# Patient Record
Sex: Female | Born: 1940 | Race: White | Hispanic: No | Smoking: Former smoker
Health system: Southern US, Community
[De-identification: ages and names within clinical notes are randomized; demographics above are authoritative.]

## PROBLEM LIST (undated history)

## (undated) DIAGNOSIS — E7800 Pure hypercholesterolemia, unspecified: Secondary | ICD-10-CM

## (undated) DIAGNOSIS — Z9981 Dependence on supplemental oxygen: Secondary | ICD-10-CM

## (undated) DIAGNOSIS — J449 Chronic obstructive pulmonary disease, unspecified: Secondary | ICD-10-CM

## (undated) DIAGNOSIS — I509 Heart failure, unspecified: Secondary | ICD-10-CM

## (undated) DIAGNOSIS — C801 Malignant (primary) neoplasm, unspecified: Secondary | ICD-10-CM

## (undated) DIAGNOSIS — I739 Peripheral vascular disease, unspecified: Secondary | ICD-10-CM

## (undated) DIAGNOSIS — I2510 Atherosclerotic heart disease of native coronary artery without angina pectoris: Secondary | ICD-10-CM

## (undated) DIAGNOSIS — F329 Major depressive disorder, single episode, unspecified: Secondary | ICD-10-CM

## (undated) DIAGNOSIS — G8929 Other chronic pain: Secondary | ICD-10-CM

## (undated) DIAGNOSIS — I779 Disorder of arteries and arterioles, unspecified: Secondary | ICD-10-CM

## (undated) DIAGNOSIS — Z8673 Personal history of transient ischemic attack (TIA), and cerebral infarction without residual deficits: Secondary | ICD-10-CM

## (undated) DIAGNOSIS — G4730 Sleep apnea, unspecified: Secondary | ICD-10-CM

## (undated) DIAGNOSIS — N19 Unspecified kidney failure: Secondary | ICD-10-CM

## (undated) DIAGNOSIS — K219 Gastro-esophageal reflux disease without esophagitis: Secondary | ICD-10-CM

## (undated) DIAGNOSIS — M1990 Unspecified osteoarthritis, unspecified site: Secondary | ICD-10-CM

## (undated) DIAGNOSIS — F32A Depression, unspecified: Secondary | ICD-10-CM

## (undated) HISTORY — DX: Personal history of transient ischemic attack (TIA), and cerebral infarction without residual deficits: Z86.73

## (undated) HISTORY — DX: Malignant (primary) neoplasm, unspecified: C80.1

## (undated) HISTORY — DX: Disorder of arteries and arterioles, unspecified: I77.9

## (undated) HISTORY — PX: BLADDER SUSPENSION: SHX72

## (undated) HISTORY — DX: Atherosclerotic heart disease of native coronary artery without angina pectoris: I25.10

## (undated) HISTORY — PX: ABDOMINAL HYSTERECTOMY: SHX81

## (undated) HISTORY — DX: Chronic obstructive pulmonary disease, unspecified: J44.9

## (undated) HISTORY — PX: COLONOSCOPY: SHX174

## (undated) HISTORY — DX: Unspecified osteoarthritis, unspecified site: M19.90

## (undated) HISTORY — DX: Peripheral vascular disease, unspecified: I73.9

---

## 2014-02-20 ENCOUNTER — Encounter: Payer: Self-pay | Admitting: Cardiology

## 2014-02-20 ENCOUNTER — Ambulatory Visit (INDEPENDENT_AMBULATORY_CARE_PROVIDER_SITE_OTHER): Payer: Medicare (Managed Care) | Admitting: Cardiology

## 2014-02-20 VITALS — BP 96/60 | HR 74 | Ht 66.0 in | Wt 203.0 lb

## 2014-02-20 DIAGNOSIS — E785 Hyperlipidemia, unspecified: Secondary | ICD-10-CM | POA: Insufficient documentation

## 2014-02-20 DIAGNOSIS — I6529 Occlusion and stenosis of unspecified carotid artery: Secondary | ICD-10-CM | POA: Insufficient documentation

## 2014-02-20 DIAGNOSIS — I25119 Atherosclerotic heart disease of native coronary artery with unspecified angina pectoris: Secondary | ICD-10-CM

## 2014-02-20 DIAGNOSIS — I779 Disorder of arteries and arterioles, unspecified: Secondary | ICD-10-CM

## 2014-02-20 DIAGNOSIS — I2510 Atherosclerotic heart disease of native coronary artery without angina pectoris: Secondary | ICD-10-CM | POA: Insufficient documentation

## 2014-02-20 DIAGNOSIS — I6523 Occlusion and stenosis of bilateral carotid arteries: Secondary | ICD-10-CM | POA: Diagnosis not present

## 2014-02-20 DIAGNOSIS — I739 Peripheral vascular disease, unspecified: Principal | ICD-10-CM

## 2014-02-20 DIAGNOSIS — Z0000 Encounter for general adult medical examination without abnormal findings: Secondary | ICD-10-CM

## 2014-02-20 NOTE — Progress Notes (Signed)
Reason for visit: CAD, carotid artery disease, hyperlipidemia  Clinical Summary [place] is a 73 y.o.female presenting to establish cardiology follow-up. She is here with her husband today. They moved here from [place] approximately 2 months ago, she has several family members here in [place]. At this point she has not established with a primary care provider, husband states that they had made several phone calls with no LOC as yet.  I reviewed available records and her cardiac history is detailed below. She reports occasional angina symptoms and nitroglycerin use, nothing progressive over the last 6 months however. Cardiac regimen looks to be fairly well-rounded, as outlined below.  She was previously followed by Dr. Randa [place] with [facility] in [place]. Following an abnormal stress test demonstrating anterior ischemia, she underwent cardiac catheterization at [place] in March 2013 which demonstrated an occluded mid LAD with left to left collaterals from the diagonal system, 75% first diagonal stenosis. She was managed medically with otherwise minor residual atherosclerosis in the circumflex and RCA distribution and preserved LVEF.  Carotid Dopplers from 2009 reported tortuous bilateral ICAs with 40-60% stenosis on the right. She may have had follow-up studies since that time based on her discussion, although I have no reports.  Lab work from May of this year showed potassium 4.6, BUN 31, creatinine 2.2, hemoglobin 11.1, platelets 269, cholesterol 194, LDL 73, triglycerides 70, AST 28, ALT 27. She states that she has tolerated Lipitor.   Allergies  Allergen Reactions  . Ace Inhibitors Swelling    Current Outpatient Prescriptions  Medication Sig Dispense Refill  . albuterol (PROVENTIL) (2.5 MG/3ML) 0.083% nebulizer solution Take 2.5 mg by nebulization every 6 (six) hours as needed for wheezing or shortness of breath.    .  ARIPiprazole (ABILIFY) 30 MG tablet Take 30 mg by mouth daily.    Marland Kitchen aspirin 81 MG tablet Take 81 mg by mouth daily.    Marland Kitchen atorvastatin (LIPITOR) 80 MG tablet Take 80 mg by mouth daily.    . budesonide-formoterol (SYMBICORT) 160-4.5 MCG/ACT inhaler Inhale 2 puffs into the lungs 2 (two) times daily.    . clopidogrel (PLAVIX) 75 MG tablet Take 75 mg by mouth daily.    . ferrous sulfate 325 (65 FE) MG tablet Take 325 mg by mouth 2 (two) times daily with a meal.    . folic acid (FOLVITE) 248 MCG tablet Take 400 mcg by mouth daily.    . furosemide (LASIX) 40 MG tablet Take 40 mg by mouth as needed.    . hydrochlorothiazide (HYDRODIURIL) 25 MG tablet Take 25 mg by mouth daily.    . isosorbide mononitrate (IMDUR) 30 MG 24 hr tablet Take 30 mg by mouth daily.    . lansoprazole (PREVACID) 30 MG capsule Take 30 mg by mouth daily at 12 noon.    Marland Kitchen LORazepam (ATIVAN) 1 MG tablet Take 1 mg by mouth 2 (two) times daily as needed for anxiety.    . metoprolol (LOPRESSOR) 50 MG tablet Take 50 mg by mouth daily.     . niacin (NIASPAN) 500 MG CR tablet Take 500 mg by mouth at bedtime.    . nitroGLYCERIN (NITROSTAT) 0.4 MG SL tablet Place 0.4 mg under the tongue every 5 (five) minutes as needed for chest pain.    . ranolazine (RANEXA) 500 MG 12 hr tablet Take 500 mg by mouth 2 (two) times daily.    Marland Kitchen venlafaxine XR (EFFEXOR-XR) 150 MG 24 hr capsule Take  150 mg by mouth daily with breakfast.     No current facility-administered medications for this visit.    Past Medical History  Diagnosis Date  . Osteoarthritis   . History of stroke   . COPD (chronic obstructive pulmonary disease)   . Carotid artery disease   . CAD (coronary artery disease), native coronary artery     Known occluded mid LAD with left to left collaterals, 75% first diagonal - cardiac catheterization in March 2013, [place]  . Cancer     Past Surgical History  Procedure Laterality Date  . Abdominal hysterectomy    . Bladder suspension     . Colonoscopy      Family History  Problem Relation Age of Onset  . CAD Mother   . CAD Father   . Diabetes Mellitus II Sister   . Hypertension Brother   . CAD Brother     Social History [name] reports that she has quit smoking. Her smoking use included Cigarettes. She smoked 0.00 packs per day. She does not have any smokeless tobacco history on file. [name] reports that she does not drink alcohol.  Review of Systems Complete review of systems negative except as otherwise outlined in the clinical summary and also the following.  Physical Examination Filed Vitals:   02/20/14 1114  BP: 96/60  Pulse: 74   Filed Weights   02/20/14 1114  Weight: 203 lb (92.08 kg)   Chronically ill-appearing woman, no distress. HEENT: Conjunctiva and lids normal, oropharynx clear. Neck: Supple, no elevated JVP, soft right carotid bruit, no thyromegaly. Lungs: Clear to auscultation with diminished breath sounds throughout, nonlabored breathing at rest. Cardiac: Regular rate and rhythm, no S3 or significant systolic murmur, no pericardial rub. Abdomen: Soft, nontender, bowel sounds present, no guarding or rebound. Extremities: Trace ankle edema, distal pulses 2+. Skin: Warm and dry. Musculoskeletal: Mild kyphosis. Neuropsychiatric: Alert and oriented x3, affect grossly appropriate.   Problem List and Plan   CAD (coronary artery disease), native coronary artery Cardiac history outlined above. Patient establishing for regular cardiac follow-up. I reviewed her cardiac medications. No additional schema testing being arranged at this time, most recent heart catheterization in 2013 reviewed. 6 month follow-up arranged, sooner if needed.  Carotid artery occlusion Not entirely clear when this was last objectively evaluated. Follow-up carotid Dopplers will be obtained. She continues on antiplatelet regimen and statin.  Hyperlipidemia Continue high-dose Lipitor. Follow-up FLP and LFTs for  new baseline.  Healthcare maintenance Information about local primary care providers given to the patient and her husband. Office also assisted in arranging a visit with Dr. Legrand Sims. I made it clear that she needs to establish with a primary care provider soon to address other health concerns.    Satira Sark, M.D., F.A.C.C.

## 2014-02-20 NOTE — Patient Instructions (Addendum)
Your physician wants you to follow-up in: 6 months You will receive a reminder letter in the mail two months in advance. If you don't receive a letter, please call our office to schedule the follow-up appointment.    Please get FASTING lab work (LFT'S,LIPID)    Your physician recommends that you continue on your current medications as directed. Please refer to the Current Medication list given to you today.     Your physician has requested that you have a carotid duplex. This test is an ultrasound of the carotid arteries in your neck. It looks at blood flow through these arteries that supply the brain with blood. Allow one hour for this exam. There are no restrictions or special instructions.          Thank you for choosing [facility] !       Dr Legrand Rams [phone] is taking new patients

## 2014-02-20 NOTE — Assessment & Plan Note (Signed)
Information about local primary care providers given to the patient and her husband. Office also assisted in arranging a visit with Dr. Legrand Rams. I made it clear that she needs to establish with a primary care provider soon to address other health concerns.

## 2014-02-20 NOTE — Assessment & Plan Note (Signed)
Continue high-dose Lipitor. Follow-up FLP and LFTs for new baseline.

## 2014-02-20 NOTE — Assessment & Plan Note (Signed)
Not entirely clear when this was last objectively evaluated. Follow-up carotid Dopplers will be obtained. She continues on antiplatelet regimen and statin.

## 2014-02-20 NOTE — Assessment & Plan Note (Signed)
Cardiac history outlined above. Patient establishing for regular cardiac follow-up. I reviewed her cardiac medications. No additional schema testing being arranged at this time, most recent heart catheterization in 2013 reviewed. 6 month follow-up arranged, sooner if needed.

## 2014-03-01 ENCOUNTER — Encounter (HOSPITAL_COMMUNITY): Payer: Self-pay | Admitting: Emergency Medicine

## 2014-03-01 ENCOUNTER — Inpatient Hospital Stay (HOSPITAL_COMMUNITY)
Admission: EM | Admit: 2014-03-01 | Discharge: 2014-03-06 | DRG: 291 | Disposition: A | Discharge status: Home Health | Payer: Medicare (Managed Care) | Attending: Internal Medicine | Admitting: Internal Medicine

## 2014-03-01 ENCOUNTER — Emergency Department (HOSPITAL_COMMUNITY): Payer: Medicare (Managed Care)

## 2014-03-01 DIAGNOSIS — K219 Gastro-esophageal reflux disease without esophagitis: Secondary | ICD-10-CM | POA: Diagnosis present

## 2014-03-01 DIAGNOSIS — R0602 Shortness of breath: Secondary | ICD-10-CM | POA: Diagnosis present

## 2014-03-01 DIAGNOSIS — Z7902 Long term (current) use of antithrombotics/antiplatelets: Secondary | ICD-10-CM | POA: Diagnosis not present

## 2014-03-01 DIAGNOSIS — Z8249 Family history of ischemic heart disease and other diseases of the circulatory system: Secondary | ICD-10-CM

## 2014-03-01 DIAGNOSIS — I5033 Acute on chronic diastolic (congestive) heart failure: Secondary | ICD-10-CM | POA: Diagnosis present

## 2014-03-01 DIAGNOSIS — M1990 Unspecified osteoarthritis, unspecified site: Secondary | ICD-10-CM | POA: Diagnosis present

## 2014-03-01 DIAGNOSIS — E780 Pure hypercholesterolemia: Secondary | ICD-10-CM | POA: Diagnosis present

## 2014-03-01 DIAGNOSIS — E785 Hyperlipidemia, unspecified: Secondary | ICD-10-CM | POA: Diagnosis present

## 2014-03-01 DIAGNOSIS — J441 Chronic obstructive pulmonary disease with (acute) exacerbation: Secondary | ICD-10-CM | POA: Diagnosis present

## 2014-03-01 DIAGNOSIS — Z9981 Dependence on supplemental oxygen: Secondary | ICD-10-CM | POA: Diagnosis not present

## 2014-03-01 DIAGNOSIS — Z87891 Personal history of nicotine dependence: Secondary | ICD-10-CM

## 2014-03-01 DIAGNOSIS — I2510 Atherosclerotic heart disease of native coronary artery without angina pectoris: Secondary | ICD-10-CM | POA: Diagnosis present

## 2014-03-01 DIAGNOSIS — F419 Anxiety disorder, unspecified: Secondary | ICD-10-CM | POA: Diagnosis present

## 2014-03-01 DIAGNOSIS — R42 Dizziness and giddiness: Secondary | ICD-10-CM

## 2014-03-01 DIAGNOSIS — I509 Heart failure, unspecified: Secondary | ICD-10-CM

## 2014-03-01 DIAGNOSIS — D649 Anemia, unspecified: Secondary | ICD-10-CM | POA: Diagnosis present

## 2014-03-01 DIAGNOSIS — J189 Pneumonia, unspecified organism: Secondary | ICD-10-CM

## 2014-03-01 DIAGNOSIS — Z8673 Personal history of transient ischemic attack (TIA), and cerebral infarction without residual deficits: Secondary | ICD-10-CM

## 2014-03-01 DIAGNOSIS — N183 Chronic kidney disease, stage 3 unspecified: Secondary | ICD-10-CM

## 2014-03-01 DIAGNOSIS — Z79899 Other long term (current) drug therapy: Secondary | ICD-10-CM | POA: Diagnosis not present

## 2014-03-01 DIAGNOSIS — Z7982 Long term (current) use of aspirin: Secondary | ICD-10-CM | POA: Diagnosis not present

## 2014-03-01 DIAGNOSIS — Z833 Family history of diabetes mellitus: Secondary | ICD-10-CM | POA: Diagnosis not present

## 2014-03-01 HISTORY — DX: Other chronic pain: G89.29

## 2014-03-01 HISTORY — DX: Major depressive disorder, single episode, unspecified: F32.9

## 2014-03-01 HISTORY — DX: Gastro-esophageal reflux disease without esophagitis: K21.9

## 2014-03-01 HISTORY — DX: Pure hypercholesterolemia, unspecified: E78.00

## 2014-03-01 HISTORY — DX: Depression, unspecified: F32.A

## 2014-03-01 HISTORY — DX: Dependence on supplemental oxygen: Z99.81

## 2014-03-01 LAB — CBC WITH DIFFERENTIAL/PLATELET
BASOS ABS: 0 10*3/uL (ref 0.0–0.1)
BASOS PCT: 0 % (ref 0–1)
EOS ABS: 0.4 10*3/uL (ref 0.0–0.7)
Eosinophils Relative: 5 % (ref 0–5)
HCT: 31.3 % — ABNORMAL LOW (ref 36.0–46.0)
Hemoglobin: 10.1 g/dL — ABNORMAL LOW (ref 12.0–15.0)
Lymphocytes Relative: 29 % (ref 12–46)
Lymphs Abs: 2.6 10*3/uL (ref 0.7–4.0)
MCH: 31.2 pg (ref 26.0–34.0)
MCHC: 32.3 g/dL (ref 30.0–36.0)
MCV: 96.6 fL (ref 78.0–100.0)
MONOS PCT: 8 % (ref 3–12)
Monocytes Absolute: 0.7 10*3/uL (ref 0.1–1.0)
NEUTROS ABS: 5.3 10*3/uL (ref 1.7–7.7)
NEUTROS PCT: 58 % (ref 43–77)
PLATELETS: 237 10*3/uL (ref 150–400)
RBC: 3.24 MIL/uL — ABNORMAL LOW (ref 3.87–5.11)
RDW: 13.5 % (ref 11.5–15.5)
WBC: 9 10*3/uL (ref 4.0–10.5)

## 2014-03-01 LAB — CBC
HCT: 31.5 % — ABNORMAL LOW (ref 36.0–46.0)
Hemoglobin: 10.6 g/dL — ABNORMAL LOW (ref 12.0–15.0)
MCH: 31.9 pg (ref 26.0–34.0)
MCHC: 33.7 g/dL (ref 30.0–36.0)
MCV: 94.9 fL (ref 78.0–100.0)
PLATELETS: 223 10*3/uL (ref 150–400)
RBC: 3.32 MIL/uL — ABNORMAL LOW (ref 3.87–5.11)
RDW: 13.9 % (ref 11.5–15.5)
WBC: 9.9 10*3/uL (ref 4.0–10.5)

## 2014-03-01 LAB — BASIC METABOLIC PANEL
ANION GAP: 12 (ref 5–15)
BUN: 20 mg/dL (ref 6–23)
CALCIUM: 9.4 mg/dL (ref 8.4–10.5)
CO2: 30 mEq/L (ref 19–32)
Chloride: 97 mEq/L (ref 96–112)
Creatinine, Ser: 1.41 mg/dL — ABNORMAL HIGH (ref 0.50–1.10)
GFR, EST AFRICAN AMERICAN: 41 mL/min — AB (ref >90)
GFR, EST NON AFRICAN AMERICAN: 36 mL/min — AB (ref >90)
GLUCOSE: 114 mg/dL — AB (ref 70–99)
Potassium: 4.1 mEq/L (ref 3.7–5.3)
Sodium: 139 mEq/L (ref 137–147)

## 2014-03-01 LAB — PRO B NATRIURETIC PEPTIDE: Pro B Natriuretic peptide (BNP): 834.7 pg/mL — ABNORMAL HIGH (ref 0–125)

## 2014-03-01 LAB — TROPONIN I

## 2014-03-01 MED ORDER — ALBUTEROL (5 MG/ML) CONTINUOUS INHALATION SOLN
10.0000 mg/h | INHALATION_SOLUTION | Freq: Once | RESPIRATORY_TRACT | Status: AC
  Administered 2014-03-01: 10 mg/h via RESPIRATORY_TRACT

## 2014-03-01 MED ORDER — METOPROLOL TARTRATE 50 MG PO TABS
75.0000 mg | ORAL_TABLET | Freq: Every morning | ORAL | Status: DC
  Administered 2014-03-02 – 2014-03-05 (×4): 75 mg via ORAL
  Filled 2014-03-01 (×10): qty 1

## 2014-03-01 MED ORDER — BUDESONIDE 0.25 MG/2ML IN SUSP
0.2500 mg | Freq: Two times a day (BID) | RESPIRATORY_TRACT | Status: DC
  Administered 2014-03-02 – 2014-03-06 (×9): 0.25 mg via RESPIRATORY_TRACT
  Filled 2014-03-01 (×11): qty 2

## 2014-03-01 MED ORDER — ASPIRIN EC 325 MG PO TBEC
325.0000 mg | DELAYED_RELEASE_TABLET | Freq: Every day | ORAL | Status: DC
  Filled 2014-03-01: qty 1

## 2014-03-01 MED ORDER — IPRATROPIUM BROMIDE 0.02 % IN SOLN
0.5000 mg | Freq: Four times a day (QID) | RESPIRATORY_TRACT | Status: DC
  Administered 2014-03-02 (×4): 0.5 mg via RESPIRATORY_TRACT
  Filled 2014-03-01 (×4): qty 2.5

## 2014-03-01 MED ORDER — FUROSEMIDE 10 MG/ML IJ SOLN
40.0000 mg | Freq: Two times a day (BID) | INTRAMUSCULAR | Status: DC
  Administered 2014-03-02 – 2014-03-03 (×3): 40 mg via INTRAVENOUS
  Filled 2014-03-01 (×3): qty 4

## 2014-03-01 MED ORDER — ONDANSETRON HCL 4 MG/2ML IJ SOLN: 4.0000 mg | Freq: Four times a day (QID) | INTRAMUSCULAR | Status: DC | PRN

## 2014-03-01 MED ORDER — IPRATROPIUM BROMIDE 0.02 % IN SOLN
RESPIRATORY_TRACT | Status: AC
  Administered 2014-03-01: 18:00:00
  Filled 2014-03-01: qty 2.5

## 2014-03-01 MED ORDER — FOLIC ACID 1 MG PO TABS
1000.0000 ug | ORAL_TABLET | Freq: Every morning | ORAL | Status: DC
  Administered 2014-03-02 – 2014-03-05 (×4): 1 mg via ORAL
  Filled 2014-03-01 (×5): qty 1

## 2014-03-01 MED ORDER — NITROGLYCERIN 0.4 MG SL SUBL: 0.4000 mg | SUBLINGUAL_TABLET | SUBLINGUAL | Status: DC | PRN

## 2014-03-01 MED ORDER — ONDANSETRON HCL 4 MG PO TABS
4.0000 mg | ORAL_TABLET | Freq: Four times a day (QID) | ORAL | Status: DC | PRN
  Administered 2014-03-03: 4 mg via ORAL
  Filled 2014-03-01: qty 1

## 2014-03-01 MED ORDER — NIACIN ER (ANTIHYPERLIPIDEMIC) 500 MG PO TBCR
500.0000 mg | EXTENDED_RELEASE_TABLET | Freq: Every day | ORAL | Status: DC
Start: 2014-03-01 — End: 2014-03-06
  Administered 2014-03-02 – 2014-03-05 (×5): 500 mg via ORAL
  Filled 2014-03-01 (×5): qty 1

## 2014-03-01 MED ORDER — RANOLAZINE ER 500 MG PO TB12
500.0000 mg | ORAL_TABLET | Freq: Two times a day (BID) | ORAL | Status: DC
Start: 2014-03-01 — End: 2014-03-06
  Administered 2014-03-02 – 2014-03-05 (×9): 500 mg via ORAL
  Filled 2014-03-01 (×10): qty 1

## 2014-03-01 MED ORDER — VENLAFAXINE HCL ER 75 MG PO CP24
150.0000 mg | ORAL_CAPSULE | Freq: Every day | ORAL | Status: DC
Start: 2014-03-02 — End: 2014-03-06
  Administered 2014-03-02 – 2014-03-05 (×4): 150 mg via ORAL
  Filled 2014-03-01 (×5): qty 2

## 2014-03-01 MED ORDER — NIACIN 500 MG PO TABS
ORAL_TABLET | ORAL | Status: AC
  Filled 2014-03-01: qty 1

## 2014-03-01 MED ORDER — MONTELUKAST SODIUM 10 MG PO TABS
10.0000 mg | ORAL_TABLET | Freq: Every morning | ORAL | Status: DC
  Administered 2014-03-02 – 2014-03-05 (×4): 10 mg via ORAL
  Filled 2014-03-01 (×5): qty 1

## 2014-03-01 MED ORDER — CLOPIDOGREL BISULFATE 75 MG PO TABS
75.0000 mg | ORAL_TABLET | Freq: Every day | ORAL | Status: DC
Start: 2014-03-02 — End: 2014-03-06
  Administered 2014-03-02 – 2014-03-05 (×4): 75 mg via ORAL
  Filled 2014-03-01 (×5): qty 1

## 2014-03-01 MED ORDER — IPRATROPIUM BROMIDE 0.02 % IN SOLN
1.0000 mg | Freq: Once | RESPIRATORY_TRACT | Status: AC
  Administered 2014-03-01: 1 mg via RESPIRATORY_TRACT

## 2014-03-01 MED ORDER — ARIPIPRAZOLE 10 MG PO TABS
30.0000 mg | ORAL_TABLET | Freq: Every morning | ORAL | Status: DC
  Administered 2014-03-02 – 2014-03-05 (×4): 30 mg via ORAL
  Filled 2014-03-01 (×5): qty 3

## 2014-03-01 MED ORDER — ISOSORBIDE MONONITRATE ER 60 MG PO TB24
30.0000 mg | ORAL_TABLET | Freq: Every morning | ORAL | Status: DC
Start: 2014-03-02 — End: 2014-03-06
  Administered 2014-03-02 – 2014-03-05 (×4): 30 mg via ORAL
  Filled 2014-03-01 (×5): qty 1

## 2014-03-01 MED ORDER — ATORVASTATIN CALCIUM 40 MG PO TABS
80.0000 mg | ORAL_TABLET | Freq: Every evening | ORAL | Status: DC
  Administered 2014-03-02 – 2014-03-05 (×5): 80 mg via ORAL
  Filled 2014-03-01 (×6): qty 2

## 2014-03-01 MED ORDER — LEVALBUTEROL HCL 0.63 MG/3ML IN NEBU: 0.6300 mg | INHALATION_SOLUTION | Freq: Four times a day (QID) | RESPIRATORY_TRACT | Status: DC | PRN

## 2014-03-01 MED ORDER — FUROSEMIDE 10 MG/ML IJ SOLN
40.0000 mg | Freq: Once | INTRAMUSCULAR | Status: AC
  Administered 2014-03-01: 40 mg via INTRAVENOUS
  Filled 2014-03-01: qty 4

## 2014-03-01 MED ORDER — LEVALBUTEROL HCL 0.63 MG/3ML IN NEBU
0.6300 mg | INHALATION_SOLUTION | Freq: Once | RESPIRATORY_TRACT | Status: AC
  Administered 2014-03-01: 0.63 mg via RESPIRATORY_TRACT
  Filled 2014-03-01: qty 3

## 2014-03-01 MED ORDER — HYDROCODONE-ACETAMINOPHEN 5-325 MG PO TABS
1.0000 | ORAL_TABLET | Freq: Once | ORAL | Status: AC
  Administered 2014-03-01: 1 via ORAL
  Filled 2014-03-01: qty 1

## 2014-03-01 MED ORDER — ALBUTEROL (5 MG/ML) CONTINUOUS INHALATION SOLN
INHALATION_SOLUTION | RESPIRATORY_TRACT | Status: AC
  Administered 2014-03-01: 18:00:00
  Filled 2014-03-01: qty 20

## 2014-03-01 MED ORDER — ACETAMINOPHEN 325 MG PO TABS: 650.0000 mg | ORAL_TABLET | Freq: Four times a day (QID) | ORAL | Status: DC | PRN

## 2014-03-01 MED ORDER — ENOXAPARIN SODIUM 40 MG/0.4ML ~~LOC~~ SOLN
40.0000 mg | SUBCUTANEOUS | Status: DC
  Administered 2014-03-02 – 2014-03-05 (×4): 40 mg via SUBCUTANEOUS
  Filled 2014-03-01 (×4): qty 0.4

## 2014-03-01 MED ORDER — ACETAMINOPHEN 650 MG RE SUPP: 650.0000 mg | Freq: Four times a day (QID) | RECTAL | Status: DC | PRN

## 2014-03-01 MED ORDER — HYDROCHLOROTHIAZIDE 25 MG PO TABS
25.0000 mg | ORAL_TABLET | Freq: Every morning | ORAL | Status: DC
  Filled 2014-03-01: qty 1

## 2014-03-01 MED ORDER — LEVALBUTEROL HCL 0.63 MG/3ML IN NEBU
0.6300 mg | INHALATION_SOLUTION | Freq: Four times a day (QID) | RESPIRATORY_TRACT | Status: DC
Start: 2014-03-02 — End: 2014-03-02
  Administered 2014-03-02 (×4): 0.63 mg via RESPIRATORY_TRACT
  Filled 2014-03-01 (×4): qty 3

## 2014-03-01 MED ORDER — SODIUM CHLORIDE 0.9 % IJ SOLN
3.0000 mL | Freq: Two times a day (BID) | INTRAMUSCULAR | Status: DC
  Administered 2014-03-02 – 2014-03-05 (×2): 3 mL via INTRAVENOUS

## 2014-03-01 MED ORDER — ASPIRIN EC 81 MG PO TBEC
81.0000 mg | DELAYED_RELEASE_TABLET | Freq: Every morning | ORAL | Status: DC
  Administered 2014-03-02 – 2014-03-05 (×4): 81 mg via ORAL
  Filled 2014-03-01 (×5): qty 1

## 2014-03-01 MED ORDER — PANTOPRAZOLE SODIUM 40 MG PO TBEC
40.0000 mg | DELAYED_RELEASE_TABLET | Freq: Every day | ORAL | Status: DC
  Administered 2014-03-02 – 2014-03-05 (×4): 40 mg via ORAL
  Filled 2014-03-01 (×5): qty 1

## 2014-03-01 MED ORDER — ALBUTEROL SULFATE (2.5 MG/3ML) 0.083% IN NEBU
2.5000 mg | INHALATION_SOLUTION | Freq: Once | RESPIRATORY_TRACT | Status: AC
  Administered 2014-03-01: 2.5 mg via RESPIRATORY_TRACT
  Filled 2014-03-01: qty 3

## 2014-03-01 MED ORDER — LORAZEPAM 1 MG PO TABS
1.0000 mg | ORAL_TABLET | Freq: Two times a day (BID) | ORAL | Status: DC | PRN
Start: 2014-03-01 — End: 2014-03-06
  Administered 2014-03-02 – 2014-03-05 (×4): 1 mg via ORAL
  Filled 2014-03-01 (×4): qty 1

## 2014-03-01 MED ORDER — METHYLPREDNISOLONE SODIUM SUCC 125 MG IJ SOLR
125.0000 mg | Freq: Once | INTRAMUSCULAR | Status: AC
  Administered 2014-03-01: 125 mg via INTRAVENOUS
  Filled 2014-03-01: qty 2

## 2014-03-01 MED ORDER — SODIUM CHLORIDE 0.9 % IJ SOLN
3.0000 mL | Freq: Two times a day (BID) | INTRAMUSCULAR | Status: DC
  Administered 2014-03-02 – 2014-03-05 (×8): 3 mL via INTRAVENOUS

## 2014-03-01 MED ORDER — FERROUS SULFATE 325 (65 FE) MG PO TABS
325.0000 mg | ORAL_TABLET | Freq: Two times a day (BID) | ORAL | Status: DC
  Administered 2014-03-02 – 2014-03-05 (×8): 325 mg via ORAL
  Filled 2014-03-01 (×9): qty 1

## 2014-03-01 MED ORDER — IPRATROPIUM-ALBUTEROL 0.5-2.5 (3) MG/3ML IN SOLN
3.0000 mL | Freq: Once | RESPIRATORY_TRACT | Status: AC
  Administered 2014-03-01: 3 mL via RESPIRATORY_TRACT
  Filled 2014-03-01: qty 3

## 2014-03-01 NOTE — H&P (Signed)
[facility] History and Physical  [place] NWG:[id number] DOB: [birth year] DOA: 03/01/2014  Referring physician: ER physician. PCP: Rosita Fire, MD patient just moved from [place] and is here to establish primary care.  Specialists: Dr. Conni Elliot. Cardiologist.  Chief Complaint: Shortness of breath.  HPI: [place] is a 73 y.o. female with history of CAD managed medically, COPD, hyperlipidemia, carotid stenosis presents to the ER because of worsening shortness of breath. Patient states she has been short of breath over the last 6 days with increasing lower extremity edema. Patient shortness of breath increases on exertion and patient also has been having some productive cough with pleuritic type of chest pain. In the ER patient was found to be wheezing and was placed on nebulizer. On exam patient does have significant lower extremity edema and mildly elevated JVD. Patient is tachycardic. Patient otherwise does not have chest pain without cough. Denies any nausea vomiting abdominal pain diarrhea fever chills. Patient has history of CAD and has had cardiac cath in 2013 and is being medically managed.   Review of Systems: As presented in the history of presenting illness, rest negative.  Past Medical History  Diagnosis Date  . Osteoarthritis   . History of stroke   . COPD (chronic obstructive pulmonary disease)   . Carotid artery disease   . CAD (coronary artery disease), native coronary artery     Known occluded mid LAD with left to left collaterals, 75% first diagonal - cardiac catheterization in March 2013, [facility]  . Cancer   . Chronic pain   . On home O2   . GERD (gastroesophageal reflux disease)   . Depression   . Hypercholesterolemia    Past Surgical History  Procedure Laterality Date  . Abdominal hysterectomy    . Bladder suspension    . Colonoscopy     Social History:  reports that she quit smoking about 4 years ago. Her smoking use included  Cigarettes. She has a 67.5 pack-year smoking history. She has never used smokeless tobacco. She reports that she does not drink alcohol or use illicit drugs. Where does patient live home. Can patient participate in ADLs? Yes.  Allergies  Allergen Reactions  . Ace Inhibitors Swelling    Family History:  Family History  Problem Relation Age of Onset  . CAD Mother   . Heart failure Mother   . CAD Father   . Heart failure Father   . Diabetes Mellitus II Sister   . Hypertension Brother   . CAD Brother       Prior to Admission medications   Medication Sig Start Date End Date Taking? Authorizing Provider  acetaminophen-codeine (TYLENOL #4) 300-60 MG per tablet Take 1 tablet by mouth 2 (two) times daily. *MAy take one additional tablet for back pain 01/27/14  Yes Historical Provider, MD  albuterol (PROAIR HFA) 108 (90 BASE) MCG/ACT inhaler Inhale 2 puffs into the lungs every 6 (six) hours as needed for wheezing or shortness of breath.   Yes Historical Provider, MD  albuterol (PROVENTIL) (2.5 MG/3ML) 0.083% nebulizer solution Take 2.5 mg by nebulization every 4 (four) hours as needed for wheezing or shortness of breath.    Yes Historical Provider, MD  ARIPiprazole (ABILIFY) 30 MG tablet Take 30 mg by mouth every morning.    Yes Historical Provider, MD  aspirin EC 81 MG tablet Take 81 mg by mouth every morning.   Yes Historical Provider, MD  atorvastatin (LIPITOR) 80 MG tablet Take 80 mg by  mouth every evening.    Yes Historical Provider, MD  budesonide-formoterol (SYMBICORT) 160-4.5 MCG/ACT inhaler Inhale 2 puffs into the lungs 2 (two) times daily.   Yes Historical Provider, MD  clopidogrel (PLAVIX) 75 MG tablet Take 75 mg by mouth every morning.    Yes Historical Provider, MD  ferrous sulfate 325 (65 FE) MG tablet Take 325 mg by mouth 2 (two) times daily with a meal.   Yes Historical Provider, MD  folic acid (FOLVITE) 322 MCG tablet Take 400 mcg by mouth every morning.    Yes Historical  Provider, MD  furosemide (LASIX) 40 MG tablet Take 40 mg by mouth as needed for fluid.    Yes Historical Provider, MD  hydrochlorothiazide (HYDRODIURIL) 25 MG tablet Take 25 mg by mouth every morning.    Yes Historical Provider, MD  isosorbide mononitrate (IMDUR) 30 MG 24 hr tablet Take 30 mg by mouth every morning.    Yes Historical Provider, MD  lansoprazole (PREVACID) 30 MG capsule Take 30 mg by mouth every morning.    Yes Historical Provider, MD  LORazepam (ATIVAN) 1 MG tablet Take 1 mg by mouth 2 (two) times daily as needed for anxiety.   Yes Historical Provider, MD  metoprolol (LOPRESSOR) 50 MG tablet Take 75 mg by mouth every morning.    Yes Historical Provider, MD  montelukast (SINGULAIR) 10 MG tablet Take 10 mg by mouth every morning.  02/22/14  Yes Historical Provider, MD  Multiple Vitamin (MULTIVITAMIN WITH MINERALS) TABS tablet Take 1 tablet by mouth every morning.   Yes Historical Provider, MD  niacin (NIASPAN) 500 MG CR tablet Take 500 mg by mouth at bedtime.   Yes Historical Provider, MD  nitroGLYCERIN (NITROSTAT) 0.4 MG SL tablet Place 0.4 mg under the tongue every 5 (five) minutes as needed for chest pain.   Yes Historical Provider, MD  ranolazine (RANEXA) 500 MG 12 hr tablet Take 500 mg by mouth 2 (two) times daily.   Yes Historical Provider, MD  venlafaxine XR (EFFEXOR-XR) 150 MG 24 hr capsule Take 150 mg by mouth daily with breakfast.   Yes Historical Provider, MD    Physical Exam: Filed Vitals:   03/01/14 1808 03/01/14 1822 03/01/14 2030 03/01/14 2058  BP:   155/75   Pulse:   113   Temp:      TempSrc:      Resp:   23   Height:      Weight:      SpO2: 98% 95% 99% 97%     General:  Well-developed and nourished.  Eyes: Anicteric no pallor.  ENT: No discharge from the ears eyes nose and mouth.  Neck: Mildly elevated JVD.  Cardiovascular: S1 and S2 were tachycardic.  Respiratory: Mild expiratory wheezing and crepitations.  Abdomen: Mildly distended nontender  bowel sounds present.  Skin: No rash.  Musculoskeletal: Bilateral lower extremity edema.  Psychiatric: Appears normal.  Neurologic: Alert awake oriented to time place and person. Moves all extremities.  Labs on Admission:  Basic Metabolic Panel:  Recent Labs Lab 03/01/14 1852  NA 139  K 4.1  CL 97  CO2 30  GLUCOSE 114*  BUN 20  CREATININE 1.41*  CALCIUM 9.4   Liver Function Tests: No results for input(s): AST, ALT, ALKPHOS, BILITOT, PROT, ALBUMIN in the last 168 hours. No results for input(s): LIPASE, AMYLASE in the last 168 hours. No results for input(s): AMMONIA in the last 168 hours. CBC:  Recent Labs Lab 03/01/14 1852  WBC 9.0  NEUTROABS 5.3  HGB 10.1*  HCT 31.3*  MCV 96.6  PLT 237   Cardiac Enzymes:  Recent Labs Lab 03/01/14 1852  TROPONINI <0.30    BNP (last 3 results)  Recent Labs  03/01/14 1852  PROBNP 834.7*   CBG: No results for input(s): GLUCAP in the last 168 hours.  Radiological Exams on Admission: Dg Chest 1 View  03/01/2014   CLINICAL DATA:  Wheezing and shortness of breath.  Mid chest pain.  EXAM: CHEST - 1 VIEW  COMPARISON:  09/24/2004  FINDINGS: Heart size and pulmonary vascularity are normal and the lungs are clear. No osseous abnormality.  IMPRESSION: Normal chest.   Electronically Signed   By: Rozetta Nunnery M.D.   On: 03/01/2014 18:57    EKG: Independently reviewed. Normal sinus rhythm.  Assessment/Plan Principal Problem:   CHF exacerbation Active Problems:   CAD (coronary artery disease), native coronary artery   Hyperlipidemia   COPD exacerbation   1. Shortness of breath most likely from CHF exacerbation - as per the cardiologyand has had cardiac cath in 2013 which showed normal EF. At this time I have placed patient on Lasix 40 mg IV 1 dose now and every 12 hours 8. Closely follow intake up and metabolic panel and daily weights. Check 2-D echo for EF. 2. COPD with mild exacerbation - when patient presented to the ER  patient was found to be wheezing and was given nebulizer and IV steroids. At this time I have placed patient on nebulizer Pulmicort. 3. Tachycardia - monitor shows sinus rhythm. Probably related to nebulizer. Closely observe in telemetry for any arrhythmias. Check TSH. 4. Chest pain with history of CAD - patient's chest pain appears more pleuritic in nature. But given history of CAD we will cycle cardiac markers continue antiplatelet agents. Check 2-D echo. 5. Hyperlipidemia - continue statins. 6. Renal failure not show evidence of acute or chronic baseline creatinine not known. Closely follow metabolic panel and check urine analysis.    Code Status: Full code.  Family Communication: None.  Disposition Plan: Admit to inpatient.    Jennise Both N. [facility] Pager [phone].  If 7PM-7AM, please contact night-coverage [email or web address] Password [email or web address] 03/01/2014, 9:40 PM

## 2014-03-01 NOTE — ED Provider Notes (Signed)
CSN: [id number]     Arrival date & time 03/01/14  1740 History   First MD Initiated Contact with Patient 03/01/14 1759     Chief Complaint  Patient presents with  . Wheezing      HPI Pt was seen at [place].  Per pt and her family, c/o gradual onset and worsening of persistent cough, wheezing and SOB for the past 3 to 4 days.  Describes her symptoms as "my COPD or my CHF is acting up."  Has been using home O2 N/C, MDI and nebs without relief. States she "took a nitro" yesterday when she had an episode of "chest pain." States today she has CP "when I breathe." Denies palpitations, no back pain, no abd pain, no N/V/D, no fevers, no rash.    Past Medical History  Diagnosis Date  . Osteoarthritis   . History of stroke   . COPD (chronic obstructive pulmonary disease)   . Carotid artery disease   . CAD (coronary artery disease), native coronary artery     Known occluded mid LAD with left to left collaterals, 75% first diagonal - cardiac catheterization in March 2013, [place]  . Cancer   . Chronic pain   . On home O2   . GERD (gastroesophageal reflux disease)   . Depression   . Hypercholesterolemia    Past Surgical History  Procedure Laterality Date  . Abdominal hysterectomy    . Bladder suspension    . Colonoscopy     Family History  Problem Relation Age of Onset  . CAD Mother   . Heart failure Mother   . CAD Father   . Heart failure Father   . Diabetes Mellitus II Sister   . Hypertension Brother   . CAD Brother    History  Substance Use Topics  . Smoking status: Former Smoker -- 1.50 packs/day for 45 years    Types: Cigarettes    Quit date: 07/02/2009  . Smokeless tobacco: Never Used  . Alcohol Use: No   OB History    Gravida Para Term Preterm AB TAB SAB Ectopic Multiple Living   5 4 4  1  1   4      Review of Systems ROS: Statement: All systems negative except as marked or noted in the HPI; Constitutional: Negative for fever and chills. ; ; Eyes: Negative for eye  pain, redness and discharge. ; ; ENMT: Negative for ear pain, hoarseness, nasal congestion, sinus pressure and sore throat. ; ; Cardiovascular: Negative for palpitations, diaphoresis, and peripheral edema. ; ; Respiratory: +cough, wheezing, SOB. Negative for stridor. ; ; Gastrointestinal: Negative for nausea, vomiting, diarrhea, abdominal pain, blood in stool, hematemesis, jaundice and rectal bleeding. . ; ; Genitourinary: Negative for dysuria, flank pain and hematuria. ; ; Musculoskeletal: Negative for back pain and neck pain. Negative for swelling and trauma.; ; Skin: Negative for pruritus, rash, abrasions, blisters, bruising and skin lesion.; ; Neuro: Negative for headache, lightheadedness and neck stiffness. Negative for weakness, altered level of consciousness , altered mental status, extremity weakness, paresthesias, involuntary movement, seizure and syncope.      Allergies  Ace inhibitors  Home Medications   Prior to Admission medications   Medication Sig Start Date End Date Taking? Authorizing Provider  acetaminophen-codeine (TYLENOL #4) 300-60 MG per tablet Take 1 tablet by mouth 2 (two) times daily. *MAy take one additional tablet for back pain 01/27/14  Yes Historical Provider, MD  albuterol (PROAIR HFA) 108 (90 BASE) MCG/ACT inhaler Inhale 2  puffs into the lungs every 6 (six) hours as needed for wheezing or shortness of breath.   Yes Historical Provider, MD  albuterol (PROVENTIL) (2.5 MG/3ML) 0.083% nebulizer solution Take 2.5 mg by nebulization every 4 (four) hours as needed for wheezing or shortness of breath.    Yes Historical Provider, MD  ARIPiprazole (ABILIFY) 30 MG tablet Take 30 mg by mouth every morning.    Yes Historical Provider, MD  aspirin EC 81 MG tablet Take 81 mg by mouth every morning.   Yes Historical Provider, MD  atorvastatin (LIPITOR) 80 MG tablet Take 80 mg by mouth every evening.    Yes Historical Provider, MD  budesonide-formoterol (SYMBICORT) 160-4.5 MCG/ACT  inhaler Inhale 2 puffs into the lungs 2 (two) times daily.   Yes Historical Provider, MD  clopidogrel (PLAVIX) 75 MG tablet Take 75 mg by mouth every morning.    Yes Historical Provider, MD  ferrous sulfate 325 (65 FE) MG tablet Take 325 mg by mouth 2 (two) times daily with a meal.   Yes Historical Provider, MD  folic acid (FOLVITE) 919 MCG tablet Take 400 mcg by mouth every morning.    Yes Historical Provider, MD  furosemide (LASIX) 40 MG tablet Take 40 mg by mouth as needed for fluid.    Yes Historical Provider, MD  hydrochlorothiazide (HYDRODIURIL) 25 MG tablet Take 25 mg by mouth every morning.    Yes Historical Provider, MD  isosorbide mononitrate (IMDUR) 30 MG 24 hr tablet Take 30 mg by mouth every morning.    Yes Historical Provider, MD  lansoprazole (PREVACID) 30 MG capsule Take 30 mg by mouth every morning.    Yes Historical Provider, MD  LORazepam (ATIVAN) 1 MG tablet Take 1 mg by mouth 2 (two) times daily as needed for anxiety.   Yes Historical Provider, MD  metoprolol (LOPRESSOR) 50 MG tablet Take 75 mg by mouth every morning.    Yes Historical Provider, MD  montelukast (SINGULAIR) 10 MG tablet Take 10 mg by mouth every morning.  02/22/14  Yes Historical Provider, MD  Multiple Vitamin (MULTIVITAMIN WITH MINERALS) TABS tablet Take 1 tablet by mouth every morning.   Yes Historical Provider, MD  niacin (NIASPAN) 500 MG CR tablet Take 500 mg by mouth at bedtime.   Yes Historical Provider, MD  nitroGLYCERIN (NITROSTAT) 0.4 MG SL tablet Place 0.4 mg under the tongue every 5 (five) minutes as needed for chest pain.   Yes Historical Provider, MD  ranolazine (RANEXA) 500 MG 12 hr tablet Take 500 mg by mouth 2 (two) times daily.   Yes Historical Provider, MD  venlafaxine XR (EFFEXOR-XR) 150 MG 24 hr capsule Take 150 mg by mouth daily with breakfast.   Yes Historical Provider, MD   BP 155/75 mmHg  Pulse 113  Temp(Src) 97.7 F (36.5 C) (Oral)  Resp 23  Ht 5\' 6"  (1.676 m)  Wt 200 lb (90.719 kg)   BMI 32.30 kg/m2  SpO2 97% Physical Exam  1810: Physical examination:  Nursing notes reviewed; Vital signs and O2 SAT reviewed;  Constitutional: Well developed, Well nourished, Uncomfortable appearing.; Head:  Normocephalic, atraumatic; Eyes: EOMI, PERRL, No scleral icterus; ENMT: Mouth and pharynx normal, Mucous membranes dry; Neck: Supple, Full range of motion, No lymphadenopathy; Cardiovascular: Tachycardic rate and rhythm, No gallop; Respiratory: Breath sounds diminished & equal bilaterally, insp/exp wheezes bilat. Occasional audible wheezing. Speaking in long phrases, sitting upright, tachypneic. No retrax.; Chest: Nontender, Movement normal; Abdomen: Soft, Nontender, Nondistended, Normal bowel sounds; Genitourinary: No CVA tenderness;  Extremities: Pulses normal, No tenderness, +2 pedal edema bilat without calf asymmetry.; Neuro: AA&Ox3, Major CN grossly intact.  Speech clear. No gross focal motor or sensory deficits in extremities.; Skin: Color normal, Warm, Dry.   ED Course  Procedures     EKG Interpretation None      MDM  MDM Reviewed: previous chart, nursing note and vitals Reviewed previous: labs and ECG Interpretation: labs, ECG and x-ray Total time providing critical care: 30-74 minutes. This excludes time spent performing separately reportable procedures and services. Consults: admitting MD    CRITICAL CARE Performed by: Alfonzo Feller Total critical care time: 35 Critical care time was exclusive of separately billable procedures and treating other patients. Critical care was necessary to treat or prevent imminent or life-threatening deterioration. Critical care was time spent personally by me on the following activities: development of treatment plan with patient and/or surrogate as well as nursing, discussions with consultants, evaluation of patient's response to treatment, examination of patient, obtaining history from patient or surrogate, ordering and performing  treatments and interventions, ordering and review of laboratory studies, ordering and review of radiographic studies, pulse oximetry and re-evaluation of patient's condition.      Date: 03/01/2014  Rate: 91  Rhythm: normal sinus rhythm  QRS Axis: normal  Intervals: normal  ST/T Wave abnormalities: normal  Conduction Disutrbances:none  Narrative Interpretation:   Old EKG Reviewed: unchanged; no significant changes from previous EKG dated 06/30/2011.  Results for orders placed or performed during the hospital encounter of 67/20/94  Basic metabolic panel  Result Value Ref Range   Sodium 139 137 - 147 mEq/L   Potassium 4.1 3.7 - 5.3 mEq/L   Chloride 97 96 - 112 mEq/L   CO2 30 19 - 32 mEq/L   Glucose, Bld 114 (H) 70 - 99 mg/dL   BUN 20 6 - 23 mg/dL   Creatinine, Ser 1.41 (H) 0.50 - 1.10 mg/dL   Calcium 9.4 8.4 - 10.5 mg/dL   GFR calc non Af Amer 36 (L) >90 mL/min   GFR calc Af Amer 41 (L) >90 mL/min   Anion gap 12 5 - 15  Pro b natriuretic peptide (BNP)  Result Value Ref Range   Pro B Natriuretic peptide (BNP) 834.7 (H) 0 - 125 pg/mL  Troponin I  Result Value Ref Range   Troponin I <0.30 <0.30 ng/mL  CBC with Differential  Result Value Ref Range   WBC 9.0 4.0 - 10.5 K/uL   RBC 3.24 (L) 3.87 - 5.11 MIL/uL   Hemoglobin 10.1 (L) 12.0 - 15.0 g/dL   HCT 31.3 (L) 36.0 - 46.0 %   MCV 96.6 78.0 - 100.0 fL   MCH 31.2 26.0 - 34.0 pg   MCHC 32.3 30.0 - 36.0 g/dL   RDW 13.5 11.5 - 15.5 %   Platelets 237 150 - 400 K/uL   Neutrophils Relative % 58 43 - 77 %   Neutro Abs 5.3 1.7 - 7.7 K/uL   Lymphocytes Relative 29 12 - 46 %   Lymphs Abs 2.6 0.7 - 4.0 K/uL   Monocytes Relative 8 3 - 12 %   Monocytes Absolute 0.7 0.1 - 1.0 K/uL   Eosinophils Relative 5 0 - 5 %   Eosinophils Absolute 0.4 0.0 - 0.7 K/uL   Basophils Relative 0 0 - 1 %   Basophils Absolute 0.0 0.0 - 0.1 K/uL   Dg Chest 1 View 03/01/2014   CLINICAL DATA:  Wheezing and shortness of breath.  Mid chest  pain.  EXAM: CHEST -  1 VIEW  COMPARISON:  09/24/2004  FINDINGS: Heart size and pulmonary vascularity are normal and the lungs are clear. No osseous abnormality.  IMPRESSION: Normal chest.   Electronically Signed   By: Rozetta Nunnery M.D.   On: 03/01/2014 18:57    2100:  Pt wheezing on arrival, tachypneic: IV solumedrol and hour long neb started. After neb, pt's monitor with sinus tachycardia, Sats 98% on O2 2l N/C at rest. Pt ambulated with Sats remaining >96% while on her home O2 N/C, but pt only able to ambulate a very short distance (shorter distance than she needs to walk while in her home) without becoming increasingly SOB and "dizzy." Pt placed in wheelchair to return back to her exam room due to her symptoms. Lungs with diffuse insp/exp wheezes, +audible wheezes and tachpneic. Will dose xopenex neb now, admit. Dx and testing d/w pt and family.  Questions answered.  Verb understanding, agreeable to admit. T/C to [facility] Hal Hope, case discussed, including:  HPI, pertinent PM/SHx, VS/PE, dx testing, ED course and treatment:  Agreeable to admit, requests to write temporary orders, obtain tele bed to team APAdmits.   Francine Graven, DO 03/02/14 [phone]

## 2014-03-01 NOTE — ED Notes (Signed)
Patient c/o wheezing with cough and shortness of breath. Unsure of any fevers. Patient reports using nebs and inhalers with no relief. Patient reports mid-sternal chest pain with deep breaths-non-radiating.

## 2014-03-01 NOTE — ED Notes (Signed)
Ambulated in hall with NT and nurse from room to nurse station. Pt tired easily, sats remained above 96%. Pt became SOB and dizzy before returning to room and had to be placed in wheel chair to return to bed.

## 2014-03-02 ENCOUNTER — Inpatient Hospital Stay (HOSPITAL_COMMUNITY): Payer: Medicare (Managed Care)

## 2014-03-02 ENCOUNTER — Ambulatory Visit (HOSPITAL_COMMUNITY): Admission: RE | Admit: 2014-03-02 | Payer: Medicare Other | Source: Ambulatory Visit

## 2014-03-02 DIAGNOSIS — N183 Chronic kidney disease, stage 3 unspecified: Secondary | ICD-10-CM

## 2014-03-02 DIAGNOSIS — J189 Pneumonia, unspecified organism: Secondary | ICD-10-CM

## 2014-03-02 DIAGNOSIS — D649 Anemia, unspecified: Secondary | ICD-10-CM

## 2014-03-02 DIAGNOSIS — I517 Cardiomegaly: Secondary | ICD-10-CM

## 2014-03-02 LAB — URINALYSIS, ROUTINE W REFLEX MICROSCOPIC
Bilirubin Urine: NEGATIVE
Glucose, UA: NEGATIVE mg/dL
Hgb urine dipstick: NEGATIVE
KETONES UR: NEGATIVE mg/dL
LEUKOCYTES UA: NEGATIVE
NITRITE: NEGATIVE
PROTEIN: NEGATIVE mg/dL
Specific Gravity, Urine: <1.005 — ABNORMAL LOW (ref 1.005–1.030)
UROBILINOGEN UA: 0.2 mg/dL (ref 0.0–1.0)
pH: 5.5 (ref 5.0–8.0)

## 2014-03-02 LAB — COMPREHENSIVE METABOLIC PANEL
ALT: 19 U/L (ref 0–35)
ANION GAP: 17 — AB (ref 5–15)
AST: 26 U/L (ref 0–37)
Albumin: 3.2 g/dL — ABNORMAL LOW (ref 3.5–5.2)
Alkaline Phosphatase: 60 U/L (ref 39–117)
BUN: 22 mg/dL (ref 6–23)
CALCIUM: 9.4 mg/dL (ref 8.4–10.5)
CO2: 25 mEq/L (ref 19–32)
Chloride: 95 mEq/L — ABNORMAL LOW (ref 96–112)
Creatinine, Ser: 1.49 mg/dL — ABNORMAL HIGH (ref 0.50–1.10)
GFR, EST AFRICAN AMERICAN: 39 mL/min — AB (ref >90)
GFR, EST NON AFRICAN AMERICAN: 33 mL/min — AB (ref >90)
GLUCOSE: 162 mg/dL — AB (ref 70–99)
Potassium: 4 mEq/L (ref 3.7–5.3)
Sodium: 137 mEq/L (ref 137–147)
TOTAL PROTEIN: 6.7 g/dL (ref 6.0–8.3)
Total Bilirubin: 0.2 mg/dL — ABNORMAL LOW (ref 0.3–1.2)

## 2014-03-02 LAB — CBC WITH DIFFERENTIAL/PLATELET
Basophils Absolute: 0 10*3/uL (ref 0.0–0.1)
Basophils Relative: 0 % (ref 0–1)
EOS ABS: 0 10*3/uL (ref 0.0–0.7)
Eosinophils Relative: 0 % (ref 0–5)
HCT: 30.2 % — ABNORMAL LOW (ref 36.0–46.0)
Hemoglobin: 10.1 g/dL — ABNORMAL LOW (ref 12.0–15.0)
LYMPHS ABS: 0.4 10*3/uL — AB (ref 0.7–4.0)
Lymphocytes Relative: 5 % — ABNORMAL LOW (ref 12–46)
MCH: 31.5 pg (ref 26.0–34.0)
MCHC: 33.4 g/dL (ref 30.0–36.0)
MCV: 94.1 fL (ref 78.0–100.0)
Monocytes Absolute: 0 10*3/uL — ABNORMAL LOW (ref 0.1–1.0)
Monocytes Relative: 0 % — ABNORMAL LOW (ref 3–12)
NEUTROS ABS: 8.6 10*3/uL — AB (ref 1.7–7.7)
NEUTROS PCT: 95 % — AB (ref 43–77)
Platelets: 235 10*3/uL (ref 150–400)
RBC: 3.21 MIL/uL — AB (ref 3.87–5.11)
RDW: 13.9 % (ref 11.5–15.5)
WBC: 9 10*3/uL (ref 4.0–10.5)

## 2014-03-02 LAB — TROPONIN I
Troponin I: <0.3 ng/mL (ref <0.30)
Troponin I: <0.3 ng/mL (ref <0.30)

## 2014-03-02 LAB — CREATININE, SERUM
Creatinine, Ser: 1.42 mg/dL — ABNORMAL HIGH (ref 0.50–1.10)
GFR calc non Af Amer: 35 mL/min — ABNORMAL LOW (ref >90)
GFR, EST AFRICAN AMERICAN: 41 mL/min — AB (ref >90)

## 2014-03-02 LAB — TSH: TSH: 1.77 u[IU]/mL (ref 0.350–4.500)

## 2014-03-02 MED ORDER — PREDNISONE 20 MG PO TABS
40.0000 mg | ORAL_TABLET | Freq: Every day | ORAL | Status: DC
  Administered 2014-03-02: 40 mg via ORAL
  Filled 2014-03-02 (×2): qty 2

## 2014-03-02 MED ORDER — LEVALBUTEROL HCL 0.63 MG/3ML IN NEBU
0.6300 mg | INHALATION_SOLUTION | Freq: Four times a day (QID) | RESPIRATORY_TRACT | Status: DC
  Administered 2014-03-03 – 2014-03-06 (×13): 0.63 mg via RESPIRATORY_TRACT
  Filled 2014-03-02 (×13): qty 3

## 2014-03-02 MED ORDER — CEFTRIAXONE SODIUM IN DEXTROSE 20 MG/ML IV SOLN
1.0000 g | INTRAVENOUS | Status: DC
  Administered 2014-03-02 – 2014-03-05 (×4): 1 g via INTRAVENOUS
  Filled 2014-03-02 (×5): qty 50

## 2014-03-02 MED ORDER — IPRATROPIUM BROMIDE 0.02 % IN SOLN
0.5000 mg | Freq: Four times a day (QID) | RESPIRATORY_TRACT | Status: DC
  Administered 2014-03-03 – 2014-03-06 (×13): 0.5 mg via RESPIRATORY_TRACT
  Filled 2014-03-02 (×13): qty 2.5

## 2014-03-02 MED ORDER — HYDROCODONE-ACETAMINOPHEN 5-325 MG PO TABS
1.0000 | ORAL_TABLET | Freq: Four times a day (QID) | ORAL | Status: DC | PRN
  Administered 2014-03-02 – 2014-03-05 (×7): 1 via ORAL
  Filled 2014-03-02 (×9): qty 1

## 2014-03-02 MED ORDER — AZITHROMYCIN 250 MG PO TABS
500.0000 mg | ORAL_TABLET | ORAL | Status: DC
  Administered 2014-03-02 – 2014-03-05 (×4): 500 mg via ORAL
  Filled 2014-03-02 (×5): qty 2

## 2014-03-02 NOTE — Progress Notes (Signed)
  Echocardiogram 2D Echocardiogram has been performed.  [place], [place] 03/02/2014, 3:13 PM

## 2014-03-02 NOTE — Care Management Note (Signed)
    Page 1 of 2   03/02/2014     1:41:55 PM CARE MANAGEMENT NOTE 03/02/2014  Patient:  MALIKAH, PRINCIPATO   Account Number:  [id number]  Date Initiated:  03/02/2014  Documentation initiated by:  Theophilus Kinds  Subjective/Objective Assessment:   Pt admitted from home with CHF and pneumonia. Pt lives with her husband and will return home at discharge. Pt has home O2 with company out of [facility] and is still under contract with them for 3 more months. Pt needs rollator.     Action/Plan:   Set of scales given to pt. Pts husband would like HH Rn at discharge from [facility]. Would like rollator from [facility] and will be delivered to pts room prior to discharge.   Anticipated DC Date:  03/05/2014   Anticipated DC Plan:  [place] [facility]  CM consult      [place]   Choice offered to / List presented to:  C-1 Patient   DME arranged  Vassie Moselle      DME agency  [place] arranged  HH-1 RN  [place].   Status of service:  Completed, signed off Medicare Important Message given?   (If response is "NO", the following Medicare IM given date fields will be blank) Date Medicare IM given:   Medicare IM given by:   Date Additional Medicare IM given:   Additional Medicare IM given by:    Discharge Disposition:  [place]  Per UR Regulation:    If discussed at Long Length of Stay Meetings, dates discussed:    Comments:  03/02/14 [place], RN BSN CM

## 2014-03-02 NOTE — Evaluation (Signed)
Physical Therapy Evaluation Patient Details Name: Sharon Sims MRN: [id number] DOB: April 29, 1940 Today's Date: 03/02/2014   History of Present Illness  HPI: Sharon Sims is a 73 y.o. female with history of CAD managed medically, COPD, hyperlipidemia, carotid stenosis presents to the ER because of worsening shortness of breath. Patient states she has been short of breath over the last 6 days with increasing lower extremity edema. Patient shortness of breath increases on exertion and patient also has been having some productive cough with pleuritic type of chest pain. In the ER patient was found to be wheezing and was placed on nebulizer. On exam patient does have significant lower extremity edema and mildly elevated JVD. Patient is tachycardic. Patient otherwise does not have chest pain without cough. Denies any nausea vomiting abdominal pain diarrhea fever chills. Patient has history of CAD and has had cardiac cath in 2013 and is being medically managed.   Clinical Impression   Pt was seen for evaluation.  She is O2 dependent on 2 L O2 and Sats were 95-97% at rest and with exertion.  Her strength is WNL but mobility has been extremely compromised due to dyspnea on exertion.  She was instructed in energy conservation techniques and then in gait with a rolling walker.  The walker facilitated gait endurance significantly and she had no dyspnea with gait, able to walk a minimum of 40'.  We will request that a 4 wheeled walker be ordered for the pt. As is will be more functional for this pt with respiratory disease.  No further instruction should be needed.    Follow Up Recommendations No PT follow up    Equipment Recommendations  Rolling walker with 5" wheels (Rollator preferred)    Recommendations for Other Services   none    Precautions / Restrictions Precautions Precautions: None Restrictions Weight Bearing Restrictions: No      Mobility  Bed Mobility Overal bed mobility:  Independent                Transfers Overall transfer level: Independent                  Ambulation/Gait Ambulation/Gait assistance: Modified independent (Device/Increase time) Ambulation Distance (Feet): 40 Feet Assistive device: Rolling walker (2 wheeled) Gait Pattern/deviations: WFL(Within Functional Limits) Gait velocity: speed appropriate to control dyspnea Gait velocity interpretation: Below normal speed for age/gender General Gait Details: pt's exertional level is much better controlled using a walker to assist gait...she was instructed in the gait technique using a walker and she was very stable.  Stairs            Wheelchair Mobility    Modified Rankin (Stroke Patients Only)       Balance Overall balance assessment: No apparent balance deficits (not formally assessed)                                           Pertinent Vitals/Pain Pain Assessment: No/denies pain    Home Living Family/patient expects to be discharged to:: Private residence Living Arrangements: Spouse/significant other;Children Available Help at Discharge: Family;Available 24 hours/day Type of Home: House Home Access: Stairs to enter Entrance Stairs-Rails: Right Entrance Stairs-Number of Steps: 3 Home Layout: One level Home Equipment: None      Prior Function Level of Independence: Needs assistance   Gait / Transfers Assistance Needed: transfers and ambulates with hand held assist...pt  states that she is not able to walk but a few feet due to dyspnea  ADL's / Homemaking Assistance Needed: assist with bathing and dressing and total assist with household activities        Hand Dominance        Extremity/Trunk Assessment               Lower Extremity Assessment: Overall WFL for tasks assessed      Cervical / Trunk Assessment: Kyphotic  Communication   Communication: No difficulties  Cognition Arousal/Alertness: Awake/alert Behavior  During Therapy: WFL for tasks assessed/performed Overall Cognitive Status: Within Functional Limits for tasks assessed                      General Comments      Exercises        Assessment/Plan    PT Assessment Patent does not need any further PT services  PT Diagnosis     PT Problem List    PT Treatment Interventions     PT Goals (Current goals can be found in the Care Plan section) Acute Rehab PT Goals PT Goal Formulation: All assessment and education complete, DC therapy    Frequency     Barriers to discharge    none    Co-evaluation               End of Session Equipment Utilized During Treatment: Gait belt Activity Tolerance: Patient tolerated treatment well Patient left: in chair;with call bell/phone within reach;with family/visitor present Nurse Communication: Mobility status         Time: 1111-1145 PT Time Calculation (min) (ACUTE ONLY): 34 min   Charges:   PT Evaluation $Initial PT Evaluation Tier I: 1 Procedure PT Treatments $Gait Training: 8-22 mins   PT G CodesDemetrios Sims L 03/02/2014, 11:52 AM

## 2014-03-02 NOTE — Progress Notes (Signed)
UR chart review completed.  

## 2014-03-02 NOTE — Progress Notes (Signed)
PROGRESS NOTE  Sharon Sims ZHG:[id number] DOB: May 19, 1940 DOA: 03/01/2014 PCP: Rosita Fire, MD  Summary: 73 year old woman recently moved here from [place], past medical history coronary artery disease managed medically and oxygen dependent COPD presented with shortness of breath and increasing lower extremity edema for 6 days. Admitted for CHF exacerbation, pleuritic chest pain, mild COPD exacerbation  Assessment/Plan: 1. Acute CHF exacerbation with volume overload. Type of CHF unknown at this time. No evidence of ACS. 2. Chest pain with cough. Presume secondary to COPD and pneumonia. Troponins negative thus far. EKG nonacute.  3. Mild COPD exacerbation. Appears improved today. 4. Small left pleural effusion, suspect developing left lower lobe pneumonia (CAP). 5. Suspect chronic kidney disease stage 3. Baseline unknown.  6. Normocytic anemia, suspect anemia of chronic disease secondary to kidney disease. 7. History of coronary artery disease with catheter 2013. Medical management With aspirin, Plavix, metoprolol, Imdur, Ranexa.  8. Oxygen dependent COPD, stable.   Overall appears medically stable. Plan to continue IV diuresis, follow daily BMP, daily weights, I/O.   Follow-up 2-D echocardiogram. Consider ACE inhibitor if systolic dysfunction demonstrated.  Change to oral prednisone. Continue nebulizer therapy. Continue chronic oxygen.  Start empiric antibiotics for community-acquired pneumonia. Check sputum culture.  Discussed with Dr. Legrand Rams. He will assume care 12/1.  Code Status: full code DVT prophylaxis: Lovenox Family Communication: Discussed with husband at bedside.  Disposition Plan: Anticipate home when improved.    Murray Hodgkins, MD  [facility]  Pager [phone] If 7PM-7AM, please contact night-coverage at [email or web address], password [facility] 03/02/2014, 8:43 AM  LOS: 1 day   Consultants:    Procedures: 2-D echocardiogram  pending  Antibiotics:  Ceftriaxone 11/30 >>  Zithromax 11/30 >>  HPI/Subjective: Feeling better today. Still short of breath, still has lower extremity edema which is worse than usual. Nonproductive cough. Chest pain only with cough.  Objective: Filed Vitals:   03/01/14 2313 03/02/14 0130 03/02/14 0625 03/02/14 0649  BP:   157/73   Pulse:      Temp: 98.5 F (36.9 C)  98.1 F (36.7 C)   TempSrc:      Resp:   18   Height:      Weight:   92.806 kg (204 lb 9.6 oz)   SpO2:  97% 99% 98%    Intake/Output Summary (Last 24 hours) at 03/02/14 0843 Last data filed at 03/02/14 7989  Gross per 24 hour  Intake    222 ml  Output    900 ml  Net   -678 ml     Filed Weights   03/01/14 1752 03/01/14 2250 03/02/14 0625  Weight: 90.719 kg (200 lb) 93.441 kg (206 lb) 92.806 kg (204 lb 9.6 oz)    Exam:   Afebrile, vital signs are stable, mild tachycardia. Hypoxia stable on 2 L.  General: Appears calm, comfortable. Speech fluent and clear.  CV: Regular rate and rhythm. 2/6 systolic murmur. No rub or gallop. 3+ bilateral lower extremity edema, pitting.  Respiratory: Clear to auscultation bilaterally. No wheezes, rales or rhonchi. Normal respiratory effort.  Data Review  I/O -678. UOP 900. Weight down nearly 2 kg.  Complete metabolic panel appears stable. Creatinine stable 1.49.  Troponins negative. BNP 834 on admission  Hemoglobin 10.1, stable   Chest x-ray independently reviewed, left pleural effusion, left base atelectasis versus infiltrate. Pulmonary edema pattern.  EKG and apparently reviewed sinus rhythm, no acute changes  Sheduled Meds: . ARIPiprazole  30 mg Oral q morning - 10a  . aspirin EC  325 mg Oral Daily  . aspirin EC  81 mg Oral q morning - 10a  . atorvastatin  80 mg Oral QPM  . budesonide (PULMICORT) nebulizer solution  0.25 mg Nebulization BID  . clopidogrel  75 mg Oral QAC breakfast  . enoxaparin (LOVENOX) injection  40 mg Subcutaneous Q24H  . ferrous  sulfate  325 mg Oral BID WC  . folic acid  2,423 mcg Oral q morning - 10a  . furosemide  40 mg Intravenous Q12H  . hydrochlorothiazide  25 mg Oral q morning - 10a  . ipratropium  0.5 mg Nebulization Q6H  . isosorbide mononitrate  30 mg Oral q morning - 10a  . levalbuterol  0.63 mg Nebulization Q6H  . metoprolol  75 mg Oral q morning - 10a  . montelukast  10 mg Oral q morning - 10a  . niacin  500 mg Oral QHS  . pantoprazole  40 mg Oral Daily  . ranolazine  500 mg Oral BID  . sodium chloride  3 mL Intravenous Q12H  . sodium chloride  3 mL Intravenous Q12H  . venlafaxine XR  150 mg Oral Q breakfast   Continuous Infusions:   Principal Problem:   CHF exacerbation Active Problems:   CAD (coronary artery disease), native coronary artery   Hyperlipidemia   COPD exacerbation   CAP (community acquired pneumonia)   Normocytic anemia   CKD (chronic kidney disease), stage III   Time spent 20 minutes

## 2014-03-03 LAB — STREP PNEUMONIAE URINARY ANTIGEN: Strep Pneumo Urinary Antigen: NEGATIVE

## 2014-03-03 LAB — BASIC METABOLIC PANEL
Anion gap: 13 (ref 5–15)
BUN: 30 mg/dL — ABNORMAL HIGH (ref 6–23)
CALCIUM: 9.4 mg/dL (ref 8.4–10.5)
CO2: 31 mEq/L (ref 19–32)
CREATININE: 1.66 mg/dL — AB (ref 0.50–1.10)
Chloride: 94 mEq/L — ABNORMAL LOW (ref 96–112)
GFR calc non Af Amer: 29 mL/min — ABNORMAL LOW (ref >90)
GFR, EST AFRICAN AMERICAN: 34 mL/min — AB (ref >90)
Glucose, Bld: 121 mg/dL — ABNORMAL HIGH (ref 70–99)
Potassium: 4.1 mEq/L (ref 3.7–5.3)
Sodium: 138 mEq/L (ref 137–147)

## 2014-03-03 LAB — LEGIONELLA ANTIGEN, URINE

## 2014-03-03 MED ORDER — LIVING BETTER WITH HEART FAILURE BOOK
Freq: Once | Status: AC
Start: 2014-03-03 — End: 2014-03-03
  Administered 2014-03-03: 11:00:00

## 2014-03-03 MED ORDER — METHYLPREDNISOLONE SODIUM SUCC 40 MG IJ SOLR
40.0000 mg | Freq: Four times a day (QID) | INTRAMUSCULAR | Status: DC
  Administered 2014-03-03 – 2014-03-06 (×12): 40 mg via INTRAVENOUS
  Filled 2014-03-03 (×13): qty 1

## 2014-03-03 MED ORDER — FUROSEMIDE 40 MG PO TABS
40.0000 mg | ORAL_TABLET | Freq: Every day | ORAL | Status: DC
  Administered 2014-03-04 – 2014-03-05 (×2): 40 mg via ORAL
  Filled 2014-03-03 (×3): qty 1

## 2014-03-03 MED ORDER — GUAIFENESIN ER 600 MG PO TB12
1200.0000 mg | ORAL_TABLET | Freq: Two times a day (BID) | ORAL | Status: DC
  Administered 2014-03-03 – 2014-03-05 (×6): 1200 mg via ORAL
  Filled 2014-03-03 (×7): qty 2

## 2014-03-03 NOTE — Consult Note (Signed)
Consult requested by: Dr. 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 At home she has been short of breath with minimal exertion. She uses oxygen at home. She has not been able to be independent in her activities of daily living. She has been more short of breath for the last several days. She has been coughing. She has been coughing up some sputum.  Past Medical History  Diagnosis Date  . Osteoarthritis   . History of stroke   . COPD (chronic obstructive pulmonary disease)   . Carotid artery disease   . CAD (coronary artery disease), native coronary artery     Known occluded mid LAD with left to left collaterals, 75% first diagonal - cardiac catheterization in March 2013, [place]  . Cancer   . Chronic pain   . On home O2   . GERD (gastroesophageal reflux disease)   . Depression   . Hypercholesterolemia      Family History  Problem Relation Age of Onset  . CAD Mother   . Heart failure Mother   . CAD Father   . Heart failure Father   . Diabetes Mellitus II Sister   . Hypertension Brother   . CAD Brother      History   Social History  . Marital Status: Divorced    Spouse Name: N/A    Number of Children: N/A  . Years of Education: N/A   Social History Main Topics  . Smoking status: Former Smoker -- 1.50 packs/day for 45 years    Types: Cigarettes    Quit date: 07/02/2009  . Smokeless tobacco: Never Used  . Alcohol Use: No  . Drug Use: No  . Sexual Activity: None   Other Topics Concern  . None   Social History Narrative     ROS: She's had minimal swelling of her legs. She has not had any definite PND but has had orthopnea. No chest pain. The rest is per the history and physical    Objective: Vital signs in last 24 hours: Temp:  [98 F (36.7 C)] 98 F (36.7 C) (12/01 0546) Pulse Rate:  [70-93] 93 (12/01 0546) Resp:  [18-20]  20 (12/01 0546) BP: (123-156)/(57-69) 156/69 mmHg (12/01 0546) SpO2:  [94 %-99 %] 94 % (12/01 0745) Weight:  [93.305 kg (205 lb 11.2 oz)] 93.305 kg (205 lb 11.2 oz) (12/01 0546) Weight change: 2.586 kg (5 lb 11.2 oz) Last BM Date: 03/01/14  Intake/Output from previous day: 11/30 0701 - 12/01 0700 In: 530 [P.O.:480; IV Piggyback:50] Out: 2350 [Urine:2350]  PHYSICAL EXAM She is awake and alert. She looks acutely and chronically sick. She's coughing during the examination. Pupils are reactiveand throat are clear I can't tell for sure if she has any JVD while sitting upright. Her chest shows bilateral rhonchi. Her heart is regular without gallop. Her abdomen is soft and obese without masses. Central nervous system examination is grossly intact  Lab Results: Basic Metabolic Panel:  Recent Labs  03/02/14 0458 03/03/14 0624  NA 137 138  K 4.0 4.1  CL 95* 94*  CO2 25 31  GLUCOSE 162* 121*  BUN 22 30*  CREATININE 1.49* 1.66*  CALCIUM 9.4 9.4   Liver Function Tests:  Recent Labs  03/02/14 0458  AST 26  ALT 19  ALKPHOS 60  BILITOT 0.2*  PROT 6.7  ALBUMIN 3.2*   No results for  input(s): LIPASE, AMYLASE in the last 72 hours. No results for input(s): AMMONIA in the last 72 hours. CBC:  Recent Labs  03/01/14 1852 03/01/14 2327 03/02/14 0458  WBC 9.0 9.9 9.0  NEUTROABS 5.3  --  8.6*  HGB 10.1* 10.6* 10.1*  HCT 31.3* 31.5* 30.2*  MCV 96.6 94.9 94.1  PLT 237 223 235   Cardiac Enzymes:  Recent Labs  03/01/14 2327 03/02/14 0458 03/02/14 1045  TROPONINI <0.30 <0.30 <0.30   BNP:  Recent Labs  03/01/14 1852  PROBNP 834.7*   D-Dimer: No results for input(s): DDIMER in the last 72 hours. CBG: No results for input(s): GLUCAP in the last 72 hours. Hemoglobin A1C: No results for input(s): HGBA1C in the last 72 hours. Fasting Lipid Panel: No results for input(s): CHOL, HDL, LDLCALC, TRIG, CHOLHDL, LDLDIRECT in the last 72 hours. Thyroid Function Tests:  Recent  Labs  03/01/14 2327  TSH 1.770   Anemia Panel: No results for input(s): VITAMINB12, FOLATE, FERRITIN, TIBC, IRON, RETICCTPCT in the last 72 hours. Coagulation: No results for input(s): LABPROT, INR in the last 72 hours. Urine Drug Screen: Drugs of Abuse  No results found for: LABOPIA, COCAINSCRNUR, LABBENZ, AMPHETMU, THCU, LABBARB  Alcohol Level: No results for input(s): ETH in the last 72 hours. Urinalysis:  Recent Labs  03/02/14 0620  COLORURINE YELLOW  LABSPEC <1.005*  PHURINE 5.5  GLUCOSEU NEGATIVE  HGBUR NEGATIVE  BILIRUBINUR NEGATIVE  KETONESUR NEGATIVE  PROTEINUR NEGATIVE  UROBILINOGEN 0.2  NITRITE NEGATIVE  LEUKOCYTESUR NEGATIVE   Misc. Labs:   ABGS: No results for input(s): PHART, PO2ART, TCO2, HCO3 in the last 72 hours.  Invalid input(s): PCO2   MICROBIOLOGY: No results found for this or any previous visit (from the past 240 hour(s)).  Studies/Results: Dg Chest 1 View  03/01/2014   CLINICAL DATA:  Wheezing and shortness of breath.  Mid chest pain.  EXAM: CHEST - 1 VIEW  COMPARISON:  09/24/2004  FINDINGS: Heart size and pulmonary vascularity are normal and the lungs are clear. No osseous abnormality.  IMPRESSION: Normal chest.   Electronically Signed   By: Rozetta Nunnery M.D.   On: 03/01/2014 18:57   US Carotid Bilateral  03/02/2014   CLINICAL DATA:  Dizziness x3 months, syncope, coronary artery disease, previous tobacco abuse  EXAM: BILATERAL CAROTID DUPLEX ULTRASOUND  TECHNIQUE: Pearline Cables scale imaging, color Doppler and duplex ultrasound was performed of bilateral carotid and vertebral arteries in the neck.  COMPARISON:  None.  REVIEW OF SYSTEMS: Quantification of carotid stenosis is based on velocity parameters that correlate the residual internal carotid diameter with NASCET-based stenosis levels, using the diameter of the distal internal carotid lumen as the denominator for stenosis measurement.  The following velocity measurements were obtained:  PEAK  SYSTOLIC/END DIASTOLIC  RIGHT  ICA:                     148/23cm/sec  CCA:                     35/70VX/BLT  SYSTOLIC ICA/CCA RATIO:  9.03  DIASTOLIC ICA/CCA RATIO: 0.09  ECA:                     155cm/sec  LEFT  ICA:                     223/38cm/sec  CCA:  944/96PR/FFM  SYSTOLIC ICA/CCA RATIO:  3.84  DIASTOLIC ICA/CCA RATIO: 6.65  ECA:                     219cm/sec  FINDINGS: RIGHT CAROTID ARTERY: Eccentric partially calcified plaque from the right ICA origin into the proximal ICA , without high-grade stenosis. Normal waveforms and color Doppler signal. Distal ICA tortuous.  RIGHT VERTEBRAL ARTERY:  Normal flow direction and waveform.  LEFT CAROTID ARTERY: Partially calcified plaque at the carotid bifurcation extending into the proximal internal and external carotid arteries. Mildly elevated peak systolic velocities. At least mild stenosis on grayscale images. Distal ICA tortuous.  LEFT VERTEBRAL ARTERY: Normal flow direction and waveform.  IMPRESSION: 1. Bilateral carotid bifurcation and proximal ICA plaque, resulting in 50-69% diameter stenosis, left worse than right. The exam does not exclude plaque ulceration or embolization. Continued surveillance recommended.   Electronically Signed   By: Arne Cleveland M.D.   On: 03/02/2014 11:41    Medications:  Prior to Admission:  Prescriptions prior to admission  Medication Sig Dispense Refill Last Dose  . acetaminophen-codeine (TYLENOL #4) 300-60 MG per tablet Take 1 tablet by mouth 2 (two) times daily. *MAy take one additional tablet for back pain  0 03/01/2014 at Unknown time  . albuterol (PROAIR HFA) 108 (90 BASE) MCG/ACT inhaler Inhale 2 puffs into the lungs every 6 (six) hours as needed for wheezing or shortness of breath.   03/01/2014 at Unknown time  . albuterol (PROVENTIL) (2.5 MG/3ML) 0.083% nebulizer solution Take 2.5 mg by nebulization every 4 (four) hours as needed for wheezing or shortness of breath.    03/01/2014 at Unknown time   . ARIPiprazole (ABILIFY) 30 MG tablet Take 30 mg by mouth every morning.    03/01/2014 at Unknown time  . aspirin EC 81 MG tablet Take 81 mg by mouth every morning.   03/01/2014 at Unknown time  . atorvastatin (LIPITOR) 80 MG tablet Take 80 mg by mouth every evening.    02/28/2014 at Unknown time  . budesonide-formoterol (SYMBICORT) 160-4.5 MCG/ACT inhaler Inhale 2 puffs into the lungs 2 (two) times daily.   03/01/2014 at Unknown time  . clopidogrel (PLAVIX) 75 MG tablet Take 75 mg by mouth every morning.    03/01/2014 at Unknown time  . ferrous sulfate 325 (65 FE) MG tablet Take 325 mg by mouth 2 (two) times daily with a meal.   03/01/2014 at Unknown time  . folic acid (FOLVITE) 993 MCG tablet Take 400 mcg by mouth every morning.    03/01/2014 at Unknown time  . furosemide (LASIX) 40 MG tablet Take 40 mg by mouth as needed for fluid.    02/28/2014 at Unknown time  . hydrochlorothiazide (HYDRODIURIL) 25 MG tablet Take 25 mg by mouth every morning.    03/01/2014 at Unknown time  . isosorbide mononitrate (IMDUR) 30 MG 24 hr tablet Take 30 mg by mouth every morning.    03/01/2014 at Unknown time  . lansoprazole (PREVACID) 30 MG capsule Take 30 mg by mouth every morning.    03/01/2014 at Unknown time  . LORazepam (ATIVAN) 1 MG tablet Take 1 mg by mouth 2 (two) times daily as needed for anxiety.   Past Week at Unknown time  . metoprolol (LOPRESSOR) 50 MG tablet Take 75 mg by mouth every morning.    03/01/2014 at [place]  . montelukast (SINGULAIR) 10 MG tablet Take 10 mg by mouth every morning.   0 03/01/2014 at Unknown time  .  Multiple Vitamin (MULTIVITAMIN WITH MINERALS) TABS tablet Take 1 tablet by mouth every morning.   03/01/2014 at Unknown time  . niacin (NIASPAN) 500 MG CR tablet Take 500 mg by mouth at bedtime.   02/28/2014 at Unknown time  . nitroGLYCERIN (NITROSTAT) 0.4 MG SL tablet Place 0.4 mg under the tongue every 5 (five) minutes as needed for chest pain.   02/27/2014 at Unknown time  .  ranolazine (RANEXA) 500 MG 12 hr tablet Take 500 mg by mouth 2 (two) times daily.   03/01/2014 at Unknown time  . venlafaxine XR (EFFEXOR-XR) 150 MG 24 hr capsule Take 150 mg by mouth daily with breakfast.   03/01/2014 at Unknown time   Scheduled: . ARIPiprazole  30 mg Oral q morning - 10a  . aspirin EC  81 mg Oral q morning - 10a  . atorvastatin  80 mg Oral QPM  . azithromycin  500 mg Oral Q24H  . budesonide (PULMICORT) nebulizer solution  0.25 mg Nebulization BID  . cefTRIAXone (ROCEPHIN)  IV  1 g Intravenous Q24H  . clopidogrel  75 mg Oral QAC breakfast  . enoxaparin (LOVENOX) injection  40 mg Subcutaneous Q24H  . ferrous sulfate  325 mg Oral BID WC  . folic acid  4,680 mcg Oral q morning - 10a  . furosemide  40 mg Oral Daily  . guaiFENesin  1,200 mg Oral BID  . ipratropium  0.5 mg Nebulization QID  . isosorbide mononitrate  30 mg Oral q morning - 10a  . levalbuterol  0.63 mg Nebulization QID  . methylPREDNISolone (SOLU-MEDROL) injection  40 mg Intravenous Q6H  . metoprolol  75 mg Oral q morning - 10a  . montelukast  10 mg Oral q morning - 10a  . niacin  500 mg Oral QHS  . pantoprazole  40 mg Oral Daily  . ranolazine  500 mg Oral BID  . sodium chloride  3 mL Intravenous Q12H  . sodium chloride  3 mL Intravenous Q12H  . venlafaxine XR  150 mg Oral Q breakfast   Continuous:  HOZ:[id number] **OR** acetaminophen, HYDROcodone-acetaminophen, levalbuterol, LORazepam, nitroGLYCERIN, ondansetron **OR** ondansetron (ZOFRAN) IV  Assesment: She was admitted with increased shortness of breath that I think is a combination of community-acquired pneumonia, CHF, and COPD exacerbation. She has improved but is having difficulty coughing up any sputum. She has pretty severe shortness of breath at baseline Principal Problem:   CHF exacerbation Active Problems:   CAD (coronary artery disease), native coronary artery   Hyperlipidemia   COPD exacerbation   CAP (community acquired pneumonia)    Normocytic anemia   CKD (chronic kidney disease), stage III    Plan: I adjusted her medications. Continue with other treatments. She is going to be on IV steroids. Try Mucinex and flutter valve.    LOS: 2 days   Elide Stalzer L 03/03/2014, 8:55 AM

## 2014-03-03 NOTE — Progress Notes (Signed)
Subjective: Patient continue to complain of recurrent dry cough and shortness of breath. No fever or chills. Her Echo showed normal systolic ffunction.  Objective: Vital signs in last 24 hours: Temp:  [98 F (36.7 C)] 98 F (36.7 C) (12/01 0546) Pulse Rate:  [70-93] 93 (12/01 0546) Resp:  [18-20] 20 (12/01 0546) BP: (123-156)/(57-69) 156/69 mmHg (12/01 0546) SpO2:  [94 %-99 %] 94 % (12/01 0745) Weight:  [93.305 kg (205 lb 11.2 oz)] 93.305 kg (205 lb 11.2 oz) (12/01 0546) Weight change: 2.586 kg (5 lb 11.2 oz) Last BM Date: 03/01/14  Intake/Output from previous day: 11/30 0701 - 12/01 0700 In: 530 [P.O.:480; IV Piggyback:50] Out: 2350 [Urine:2350]  PHYSICAL EXAM General appearance: alert and no distress Resp: diminished breath sounds bilaterally and rhonchi bilaterally Cardio: S1, S2 normal GI: soft, non-tender; bowel sounds normal; no masses,  no organomegaly Extremities: extremities normal, atraumatic, no cyanosis or edema  Lab Results:  Results for orders placed or performed during the hospital encounter of 03/01/14 (from the past 48 hour(s))  Basic metabolic panel     Status: Abnormal   Collection Time: 03/01/14  6:52 PM  Result Value Ref Range   Sodium 139 137 - 147 mEq/L   Potassium 4.1 3.7 - 5.3 mEq/L   Chloride 97 96 - 112 mEq/L   CO2 30 19 - 32 mEq/L   Glucose, Bld 114 (H) 70 - 99 mg/dL   BUN 20 6 - 23 mg/dL   Creatinine, Ser 1.41 (H) 0.50 - 1.10 mg/dL   Calcium 9.4 8.4 - 10.5 mg/dL   GFR calc non Af Amer 36 (L) >90 mL/min   GFR calc Af Amer 41 (L) >90 mL/min    Comment: (NOTE) The eGFR has been calculated using the CKD EPI equation. This calculation has not been validated in all clinical situations. eGFR's persistently <90 mL/min signify possible Chronic Kidney Disease.    Anion gap 12 5 - 15  Pro b natriuretic peptide (BNP)     Status: Abnormal   Collection Time: 03/01/14  6:52 PM  Result Value Ref Range   Pro B Natriuretic peptide (BNP) 834.7 (H) 0 -  125 pg/mL  Troponin I     Status: None   Collection Time: 03/01/14  6:52 PM  Result Value Ref Range   Troponin I <0.30 <0.30 ng/mL    Comment:        Due to the release kinetics of cTnI, a negative result within the first hours of the onset of symptoms does not rule out myocardial infarction with certainty. If myocardial infarction is still suspected, repeat the test at appropriate intervals.   CBC with Differential     Status: Abnormal   Collection Time: 03/01/14  6:52 PM  Result Value Ref Range   WBC 9.0 4.0 - 10.5 K/uL   RBC 3.24 (L) 3.87 - 5.11 MIL/uL   Hemoglobin 10.1 (L) 12.0 - 15.0 g/dL   HCT 31.3 (L) 36.0 - 46.0 %   MCV 96.6 78.0 - 100.0 fL   MCH 31.2 26.0 - 34.0 pg   MCHC 32.3 30.0 - 36.0 g/dL   RDW 13.5 11.5 - 15.5 %   Platelets 237 150 - 400 K/uL   Neutrophils Relative % 58 43 - 77 %   Neutro Abs 5.3 1.7 - 7.7 K/uL   Lymphocytes Relative 29 12 - 46 %   Lymphs Abs 2.6 0.7 - 4.0 K/uL   Monocytes Relative 8 3 - 12 %   Monocytes Absolute  0.7 0.1 - 1.0 K/uL   Eosinophils Relative 5 0 - 5 %   Eosinophils Absolute 0.4 0.0 - 0.7 K/uL   Basophils Relative 0 0 - 1 %   Basophils Absolute 0.0 0.0 - 0.1 K/uL  Troponin I (q 6hr x 3)     Status: None   Collection Time: 03/01/14 11:27 PM  Result Value Ref Range   Troponin I <0.30 <0.30 ng/mL    Comment:        Due to the release kinetics of cTnI, a negative result within the first hours of the onset of symptoms does not rule out myocardial infarction with certainty. If myocardial infarction is still suspected, repeat the test at appropriate intervals.   TSH     Status: None   Collection Time: 03/01/14 11:27 PM  Result Value Ref Range   TSH 1.770 0.350 - 4.500 uIU/mL    Comment: Performed at [facility]  CBC     Status: Abnormal   Collection Time: 03/01/14 11:27 PM  Result Value Ref Range   WBC 9.9 4.0 - 10.5 K/uL   RBC 3.32 (L) 3.87 - 5.11 MIL/uL   Hemoglobin 10.6 (L) 12.0 - 15.0 g/dL   HCT 31.5 (L) 36.0 -  46.0 %   MCV 94.9 78.0 - 100.0 fL   MCH 31.9 26.0 - 34.0 pg   MCHC 33.7 30.0 - 36.0 g/dL   RDW 13.9 11.5 - 15.5 %   Platelets 223 150 - 400 K/uL  Creatinine, serum     Status: Abnormal   Collection Time: 03/01/14 11:27 PM  Result Value Ref Range   Creatinine, Ser 1.42 (H) 0.50 - 1.10 mg/dL   GFR calc non Af Amer 35 (L) >90 mL/min   GFR calc Af Amer 41 (L) >90 mL/min    Comment: (NOTE) The eGFR has been calculated using the CKD EPI equation. This calculation has not been validated in all clinical situations. eGFR's persistently <90 mL/min signify possible Chronic Kidney Disease.   Troponin I (q 6hr x 3)     Status: None   Collection Time: 03/02/14  4:58 AM  Result Value Ref Range   Troponin I <0.30 <0.30 ng/mL    Comment:        Due to the release kinetics of cTnI, a negative result within the first hours of the onset of symptoms does not rule out myocardial infarction with certainty. If myocardial infarction is still suspected, repeat the test at appropriate intervals.   Comprehensive metabolic panel     Status: Abnormal   Collection Time: 03/02/14  4:58 AM  Result Value Ref Range   Sodium 137 137 - 147 mEq/L   Potassium 4.0 3.7 - 5.3 mEq/L   Chloride 95 (L) 96 - 112 mEq/L   CO2 25 19 - 32 mEq/L   Glucose, Bld 162 (H) 70 - 99 mg/dL   BUN 22 6 - 23 mg/dL   Creatinine, Ser 1.49 (H) 0.50 - 1.10 mg/dL   Calcium 9.4 8.4 - 10.5 mg/dL   Total Protein 6.7 6.0 - 8.3 g/dL   Albumin 3.2 (L) 3.5 - 5.2 g/dL   AST 26 0 - 37 U/L   ALT 19 0 - 35 U/L   Alkaline Phosphatase 60 39 - 117 U/L   Total Bilirubin 0.2 (L) 0.3 - 1.2 mg/dL   GFR calc non Af Amer 33 (L) >90 mL/min   GFR calc Af Amer 39 (L) >90 mL/min    Comment: (NOTE) The  eGFR has been calculated using the CKD EPI equation. This calculation has not been validated in all clinical situations. eGFR's persistently <90 mL/min signify possible Chronic Kidney Disease.    Anion gap 17 (H) 5 - 15  CBC with Differential      Status: Abnormal   Collection Time: 03/02/14  4:58 AM  Result Value Ref Range   WBC 9.0 4.0 - 10.5 K/uL   RBC 3.21 (L) 3.87 - 5.11 MIL/uL   Hemoglobin 10.1 (L) 12.0 - 15.0 g/dL   HCT 30.2 (L) 36.0 - 46.0 %   MCV 94.1 78.0 - 100.0 fL   MCH 31.5 26.0 - 34.0 pg   MCHC 33.4 30.0 - 36.0 g/dL   RDW 13.9 11.5 - 15.5 %   Platelets 235 150 - 400 K/uL   Neutrophils Relative % 95 (H) 43 - 77 %   Neutro Abs 8.6 (H) 1.7 - 7.7 K/uL   Lymphocytes Relative 5 (L) 12 - 46 %   Lymphs Abs 0.4 (L) 0.7 - 4.0 K/uL   Monocytes Relative 0 (L) 3 - 12 %   Monocytes Absolute 0.0 (L) 0.1 - 1.0 K/uL   Eosinophils Relative 0 0 - 5 %   Eosinophils Absolute 0.0 0.0 - 0.7 K/uL   Basophils Relative 0 0 - 1 %   Basophils Absolute 0.0 0.0 - 0.1 K/uL  Urinalysis, Routine w reflex microscopic     Status: Abnormal   Collection Time: 03/02/14  6:20 AM  Result Value Ref Range   Color, Urine YELLOW YELLOW   APPearance CLEAR CLEAR   Specific Gravity, Urine <1.005 (L) 1.005 - 1.030   pH 5.5 5.0 - 8.0   Glucose, UA NEGATIVE NEGATIVE mg/dL   Hgb urine dipstick NEGATIVE NEGATIVE   Bilirubin Urine NEGATIVE NEGATIVE   Ketones, ur NEGATIVE NEGATIVE mg/dL   Protein, ur NEGATIVE NEGATIVE mg/dL   Urobilinogen, UA 0.2 0.0 - 1.0 mg/dL   Nitrite NEGATIVE NEGATIVE   Leukocytes, UA NEGATIVE NEGATIVE    Comment: MICROSCOPIC NOT DONE ON URINES WITH NEGATIVE PROTEIN, BLOOD, LEUKOCYTES, NITRITE, OR GLUCOSE <1000 mg/dL.  Troponin I (q 6hr x 3)     Status: None   Collection Time: 03/02/14 10:45 AM  Result Value Ref Range   Troponin I <0.30 <0.30 ng/mL    Comment:        Due to the release kinetics of cTnI, a negative result within the first hours of the onset of symptoms does not rule out myocardial infarction with certainty. If myocardial infarction is still suspected, repeat the test at appropriate intervals.   Strep pneumoniae urinary antigen     Status: None   Collection Time: 03/02/14 11:00 AM  Result Value Ref Range    Strep Pneumo Urinary Antigen NEGATIVE NEGATIVE    Comment:        Infection due to S. pneumoniae cannot be absolutely ruled out since the antigen present may be below the detection limit of the test. (NOTE) [place] Performed at [place] metabolic panel     Status: Abnormal   Collection Time: 03/03/14  6:24 AM  Result Value Ref Range   Sodium 138 137 - 147 mEq/L   Potassium 4.1 3.7 - 5.3 mEq/L   Chloride 94 (L) 96 - 112 mEq/L   CO2 31 19 - 32 mEq/L   Glucose, Bld 121 (H) 70 - 99 mg/dL   BUN 30 (H) 6 - 23 mg/dL   Creatinine, Ser 1.66 (H) 0.50 -  1.10 mg/dL   Calcium 9.4 8.4 - 10.5 mg/dL   GFR calc non Af Amer 29 (L) >90 mL/min   GFR calc Af Amer 34 (L) >90 mL/min    Comment: (NOTE) The eGFR has been calculated using the CKD EPI equation. This calculation has not been validated in all clinical situations. eGFR's persistently <90 mL/min signify possible Chronic Kidney Disease.    Anion gap 13 5 - 15    ABGS No results for input(s): PHART, PO2ART, TCO2, HCO3 in the last 72 hours.  Invalid input(s): PCO2 CULTURES No results found for this or any previous visit (from the past 240 hour(s)). Studies/Results: Dg Chest 1 View  03/01/2014   CLINICAL DATA:  Wheezing and shortness of breath.  Mid chest pain.  EXAM: CHEST - 1 VIEW  COMPARISON:  09/24/2004  FINDINGS: Heart size and pulmonary vascularity are normal and the lungs are clear. No osseous abnormality.  IMPRESSION: Normal chest.   Electronically Signed   By: Rozetta Nunnery M.D.   On: 03/01/2014 18:57   US Carotid Bilateral  03/02/2014   CLINICAL DATA:  Dizziness x3 months, syncope, coronary artery disease, previous tobacco abuse  EXAM: BILATERAL CAROTID DUPLEX ULTRASOUND  TECHNIQUE: Pearline Cables scale imaging, color Doppler and duplex ultrasound was performed of bilateral carotid and vertebral arteries in the neck.  COMPARISON:  None.  REVIEW OF SYSTEMS: Quantification of carotid stenosis is based on velocity parameters  that correlate the residual internal carotid diameter with NASCET-based stenosis levels, using the diameter of the distal internal carotid lumen as the denominator for stenosis measurement.  The following velocity measurements were obtained:  PEAK SYSTOLIC/END DIASTOLIC  RIGHT  ICA:                     148/23cm/sec  CCA:                     76/54YT/KPT  SYSTOLIC ICA/CCA RATIO:  4.65  DIASTOLIC ICA/CCA RATIO: 6.81  ECA:                     155cm/sec  LEFT  ICA:                     223/38cm/sec  CCA:                     275/17GY/FVC  SYSTOLIC ICA/CCA RATIO:  9.44  DIASTOLIC ICA/CCA RATIO: 9.67  ECA:                     219cm/sec  FINDINGS: RIGHT CAROTID ARTERY: Eccentric partially calcified plaque from the right ICA origin into the proximal ICA , without high-grade stenosis. Normal waveforms and color Doppler signal. Distal ICA tortuous.  RIGHT VERTEBRAL ARTERY:  Normal flow direction and waveform.  LEFT CAROTID ARTERY: Partially calcified plaque at the carotid bifurcation extending into the proximal internal and external carotid arteries. Mildly elevated peak systolic velocities. At least mild stenosis on grayscale images. Distal ICA tortuous.  LEFT VERTEBRAL ARTERY: Normal flow direction and waveform.  IMPRESSION: 1. Bilateral carotid bifurcation and proximal ICA plaque, resulting in 50-69% diameter stenosis, left worse than right. The exam does not exclude plaque ulceration or embolization. Continued surveillance recommended.   Electronically Signed   By: Arne Cleveland M.D.   On: 03/02/2014 11:41    Medications: I have reviewed the patient's current medications.  Assesment: Principal Problem:   CHF exacerbation Active Problems:  CAD (coronary artery disease), native coronary artery   Hyperlipidemia   COPD exacerbation   CAP (community acquired pneumonia)   Normocytic anemia   CKD (chronic kidney disease), stage III    Plan: Medications reviewed  Continue IV antibiotics, nebulizer treatment  and steroid Will do pulmonary consult Will monitor BMP    LOS: 2 days   Shawne Bulow 03/03/2014, 7:55 AM

## 2014-03-03 NOTE — Plan of Care (Signed)
Problem: Phase II Progression Outcomes Goal: Pain controlled Outcome: Completed/Met Date Met:  03/03/14 Goal: Tolerating diet Outcome: Completed/Met Date Met:  03/03/14 Goal: Fluid volume status improved Outcome: Completed/Met Date Met:  03/03/14 Goal: Case manager referral Outcome: Completed/Met Date Met:  03/03/14 Goal: Begin discharge teaching Outcome: Completed/Met Date Met:  03/03/14     

## 2014-03-04 LAB — BASIC METABOLIC PANEL
Anion gap: 11 (ref 5–15)
BUN: 35 mg/dL — AB (ref 6–23)
CO2: 31 mEq/L (ref 19–32)
CREATININE: 1.57 mg/dL — AB (ref 0.50–1.10)
Calcium: 8.9 mg/dL (ref 8.4–10.5)
Chloride: 91 mEq/L — ABNORMAL LOW (ref 96–112)
GFR, EST AFRICAN AMERICAN: 36 mL/min — AB (ref >90)
GFR, EST NON AFRICAN AMERICAN: 31 mL/min — AB (ref >90)
Glucose, Bld: 152 mg/dL — ABNORMAL HIGH (ref 70–99)
Potassium: 4.3 mEq/L (ref 3.7–5.3)
Sodium: 133 mEq/L — ABNORMAL LOW (ref 137–147)

## 2014-03-04 NOTE — Progress Notes (Signed)
Subjective: Patient feels better today. Her breathing is improved. However, continued to have cough and congestion.  Objective: Vital signs in last 24 hours: Temp:  [97.6 F (36.4 C)-99.1 F (37.3 C)] 97.6 F (36.4 C) (12/02 0524) Pulse Rate:  [78-88] 88 (12/02 0524) Resp:  [20] 20 (12/02 0524) BP: (114-154)/(56-79) 154/79 mmHg (12/02 0524) SpO2:  [95 %-98 %] 95 % (12/02 0708) Weight:  [92.262 kg (203 lb 6.4 oz)] 92.262 kg (203 lb 6.4 oz) (12/02 0524) Weight change: -1.043 kg (-2 lb 4.8 oz) Last BM Date: 03/03/14  Intake/Output from previous day: 12/01 0701 - 12/02 0700 In: 50 [IV Piggyback:50] Out: 2150 [Urine:2150]  PHYSICAL EXAM General appearance: alert and no distress Resp: diminished breath sounds bilaterally and rhonchi bilaterally Cardio: S1, S2 normal GI: soft, non-tender; bowel sounds normal; no masses,  no organomegaly Extremities: extremities normal, atraumatic, no cyanosis or edema  Lab Results:  Results for orders placed or performed during the hospital encounter of 03/01/14 (from the past 48 hour(s))  Troponin I (q 6hr x 3)     Status: None   Collection Time: 03/02/14 10:45 AM  Result Value Ref Range   Troponin I <0.30 <0.30 ng/mL    Comment:        Due to the release kinetics of cTnI, a negative result within the first hours of the onset of symptoms does not rule out myocardial infarction with certainty. If myocardial infarction is still suspected, repeat the test at appropriate intervals.   Legionella antigen, urine     Status: None   Collection Time: 03/02/14 11:00 AM  Result Value Ref Range   Specimen Description URINE, CLEAN CATCH    Special Requests NONE    Legionella Antigen, Urine      Negative for Legionella pneumophila serogroup 1                                                              Legionella pneumophila serogroup 1 antigen can be detected in urine within 2 to 3 days of infection and may persist even after treatment. This  assay  does not detect other Legionella species or serogroups. Performed at [facility]    Report Status 03/03/2014 FINAL   Strep pneumoniae urinary antigen     Status: None   Collection Time: 03/02/14 11:00 AM  Result Value Ref Range   Strep Pneumo Urinary Antigen NEGATIVE NEGATIVE    Comment:        Infection due to S. pneumoniae cannot be absolutely ruled out since the antigen present may be below the detection limit of the test. (NOTE) [place] Performed at [place] metabolic panel     Status: Abnormal   Collection Time: 03/03/14  6:24 AM  Result Value Ref Range   Sodium 138 137 - 147 mEq/L   Potassium 4.1 3.7 - 5.3 mEq/L   Chloride 94 (L) 96 - 112 mEq/L   CO2 31 19 - 32 mEq/L   Glucose, Bld 121 (H) 70 - 99 mg/dL   BUN 30 (H) 6 - 23 mg/dL   Creatinine, Ser 1.66 (H) 0.50 - 1.10 mg/dL   Calcium 9.4 8.4 - 10.5 mg/dL   GFR calc non Af Amer 29 (L) >90 mL/min   GFR calc Af Wyvonnia Lora  34 (L) >90 mL/min    Comment: (NOTE) The eGFR has been calculated using the CKD EPI equation. This calculation has not been validated in all clinical situations. eGFR's persistently <90 mL/min signify possible Chronic Kidney Disease.    Anion gap 13 5 - 15  Basic metabolic panel     Status: Abnormal   Collection Time: 03/04/14  5:10 AM  Result Value Ref Range   Sodium 133 (L) 137 - 147 mEq/L   Potassium 4.3 3.7 - 5.3 mEq/L   Chloride 91 (L) 96 - 112 mEq/L   CO2 31 19 - 32 mEq/L   Glucose, Bld 152 (H) 70 - 99 mg/dL   BUN 35 (H) 6 - 23 mg/dL   Creatinine, Ser 1.57 (H) 0.50 - 1.10 mg/dL   Calcium 8.9 8.4 - 10.5 mg/dL   GFR calc non Af Amer 31 (L) >90 mL/min   GFR calc Af Amer 36 (L) >90 mL/min    Comment: (NOTE) The eGFR has been calculated using the CKD EPI equation. This calculation has not been validated in all clinical situations. eGFR's persistently <90 mL/min signify possible Chronic Kidney Disease.    Anion gap 11 5 - 15    ABGS No results for input(s): PHART,  PO2ART, TCO2, HCO3 in the last 72 hours.  Invalid input(s): PCO2 CULTURES No results found for this or any previous visit (from the past 240 hour(s)). Studies/Results: US Carotid Bilateral  03/02/2014   CLINICAL DATA:  Dizziness x3 months, syncope, coronary artery disease, previous tobacco abuse  EXAM: BILATERAL CAROTID DUPLEX ULTRASOUND  TECHNIQUE: Pearline Cables scale imaging, color Doppler and duplex ultrasound was performed of bilateral carotid and vertebral arteries in the neck.  COMPARISON:  None.  REVIEW OF SYSTEMS: Quantification of carotid stenosis is based on velocity parameters that correlate the residual internal carotid diameter with NASCET-based stenosis levels, using the diameter of the distal internal carotid lumen as the denominator for stenosis measurement.  The following velocity measurements were obtained:  PEAK SYSTOLIC/END DIASTOLIC  RIGHT  ICA:                     148/23cm/sec  CCA:                     35/59RC/BUL  SYSTOLIC ICA/CCA RATIO:  8.45  DIASTOLIC ICA/CCA RATIO: 3.64  ECA:                     155cm/sec  LEFT  ICA:                     223/38cm/sec  CCA:                     680/32ZY/YQM  SYSTOLIC ICA/CCA RATIO:  2.50  DIASTOLIC ICA/CCA RATIO: 0.37  ECA:                     219cm/sec  FINDINGS: RIGHT CAROTID ARTERY: Eccentric partially calcified plaque from the right ICA origin into the proximal ICA , without high-grade stenosis. Normal waveforms and color Doppler signal. Distal ICA tortuous.  RIGHT VERTEBRAL ARTERY:  Normal flow direction and waveform.  LEFT CAROTID ARTERY: Partially calcified plaque at the carotid bifurcation extending into the proximal internal and external carotid arteries. Mildly elevated peak systolic velocities. At least mild stenosis on grayscale images. Distal ICA tortuous.  LEFT VERTEBRAL ARTERY: Normal flow direction and waveform.  IMPRESSION: 1. 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  The exam does not exclude plaque ulceration or embolization. Continued surveillance recommended.   Electronically Signed   By: Arne Cleveland M.D.   On: 03/02/2014 11:41    Medications: I have reviewed the patient's current medications.  Assesment: Principal Problem:   CHF exacerbation Active Problems:   CAD (coronary artery disease), native coronary artery   Hyperlipidemia   COPD exacerbation   CAP (community acquired pneumonia)   Normocytic anemia   CKD (chronic kidney disease), stage III    Plan: Medications reviewed  Continue IV antibiotics, nebulizer treatment and steroid Will do pulmonary consult appreciated Continue current treatment    LOS: 3 days   , 03/04/2014, 7:53 AM

## 2014-03-04 NOTE — Progress Notes (Addendum)
Subjective: She says she feels better. She looks much better this morning. She has no new complaints.  Objective: Vital signs in last 24 hours: Temp:  [97.6 F (36.4 C)-99.1 F (37.3 C)] 97.6 F (36.4 C) (12/02 0524) Pulse Rate:  [78-88] 88 (12/02 0524) Resp:  [20] 20 (12/02 0524) BP: (114-154)/(56-79) 154/79 mmHg (12/02 0524) SpO2:  [95 %-98 %] 95 % (12/02 0708) Weight:  [92.262 kg (203 lb 6.4 oz)] 92.262 kg (203 lb 6.4 oz) (12/02 0524) Weight change: -1.043 kg (-2 lb 4.8 oz) Last BM Date: 03/03/14  Intake/Output from previous day: 12/01 0701 - 12/02 0700 In: 50 [IV Piggyback:50] Out: 2150 [Urine:2150]  PHYSICAL EXAM General appearance: alert, cooperative, mild distress and morbidly obese Resp: rhonchi bilaterally Cardio: regular rate and rhythm, S1, S2 normal, no murmur, click, rub or gallop GI: soft, non-tender; bowel sounds normal; no masses,  no organomegaly Extremities: extremities normal, atraumatic, no cyanosis or edema  Lab Results:  Results for orders placed or performed during the hospital encounter of 03/01/14 (from the past 48 hour(s))  Troponin I (q 6hr x 3)     Status: None   Collection Time: 03/02/14 10:45 AM  Result Value Ref Range   Troponin I <0.30 <0.30 ng/mL    Comment:        Due to the release kinetics of cTnI, a negative result within the first hours of the onset of symptoms does not rule out myocardial infarction with certainty. If myocardial infarction is still suspected, repeat the test at appropriate intervals.   Legionella antigen, urine     Status: None   Collection Time: 03/02/14 11:00 AM  Result Value Ref Range   Specimen Description URINE, CLEAN CATCH    Special Requests NONE    Legionella Antigen, Urine      Negative for Legionella pneumophila serogroup 1                                                              Legionella pneumophila serogroup 1 antigen can be detected in urine within 2 to 3 days of infection and may persist even  after treatment. This  assay does not detect other Legionella species or serogroups. Performed at [facility]    Report Status 03/03/2014 FINAL   Strep pneumoniae urinary antigen     Status: None   Collection Time: 03/02/14 11:00 AM  Result Value Ref Range   Strep Pneumo Urinary Antigen NEGATIVE NEGATIVE    Comment:        Infection due to S. pneumoniae cannot be absolutely ruled out since the antigen present may be below the detection limit of the test. (NOTE) [place] Performed at [facility] metabolic panel     Status: Abnormal   Collection Time: 03/03/14  6:24 AM  Result Value Ref Range   Sodium 138 137 - 147 mEq/L   Potassium 4.1 3.7 - 5.3 mEq/L   Chloride 94 (L) 96 - 112 mEq/L   CO2 31 19 - 32 mEq/L   Glucose, Bld 121 (H) 70 - 99 mg/dL   BUN 30 (H) 6 - 23 mg/dL   Creatinine, Ser 1.66 (H) 0.50 - 1.10 mg/dL   Calcium 9.4 8.4 - 10.5 mg/dL   GFR calc non Af Amer 29 (  L) >90 mL/min   GFR calc Af Amer 34 (L) >90 mL/min    Comment: (NOTE) The eGFR has been calculated using the CKD EPI equation. This calculation has not been validated in all clinical situations. eGFR's persistently <90 mL/min signify possible Chronic Kidney Disease.    Anion gap 13 5 - 15  Basic metabolic panel     Status: Abnormal   Collection Time: 03/04/14  5:10 AM  Result Value Ref Range   Sodium 133 (L) 137 - 147 mEq/L   Potassium 4.3 3.7 - 5.3 mEq/L   Chloride 91 (L) 96 - 112 mEq/L   CO2 31 19 - 32 mEq/L   Glucose, Bld 152 (H) 70 - 99 mg/dL   BUN 35 (H) 6 - 23 mg/dL   Creatinine, Ser 1.57 (H) 0.50 - 1.10 mg/dL   Calcium 8.9 8.4 - 10.5 mg/dL   GFR calc non Af Amer 31 (L) >90 mL/min   GFR calc Af Amer 36 (L) >90 mL/min    Comment: (NOTE) The eGFR has been calculated using the CKD EPI equation. This calculation has not been validated in all clinical situations. eGFR's persistently <90 mL/min signify possible Chronic Kidney Disease.    Anion gap 11 5 - 15    ABGS No  results for input(s): PHART, PO2ART, TCO2, HCO3 in the last 72 hours.  Invalid input(s): PCO2 CULTURES No results found for this or any previous visit (from the past 240 hour(s)). Studies/Results: US Carotid Bilateral  03/02/2014   CLINICAL DATA:  Dizziness x3 months, syncope, coronary artery disease, previous tobacco abuse  EXAM: BILATERAL CAROTID DUPLEX ULTRASOUND  TECHNIQUE: Pearline Cables scale imaging, color Doppler and duplex ultrasound was performed of bilateral carotid and vertebral arteries in the neck.  COMPARISON:  None.  REVIEW OF SYSTEMS: Quantification of carotid stenosis is based on velocity parameters that correlate the residual internal carotid diameter with NASCET-based stenosis levels, using the diameter of the distal internal carotid lumen as the denominator for stenosis measurement.  The following velocity measurements were obtained:  PEAK SYSTOLIC/END DIASTOLIC  RIGHT  ICA:                     148/23cm/sec  CCA:                     16/10RU/EAV  SYSTOLIC ICA/CCA RATIO:  4.09  DIASTOLIC ICA/CCA RATIO: 8.11  ECA:                     155cm/sec  LEFT  ICA:                     223/38cm/sec  CCA:                     914/78GN/FAO  SYSTOLIC ICA/CCA RATIO:  1.30  DIASTOLIC ICA/CCA RATIO: 8.65  ECA:                     219cm/sec  FINDINGS: RIGHT CAROTID ARTERY: Eccentric partially calcified plaque from the right ICA origin into the proximal ICA , without high-grade stenosis. Normal waveforms and color Doppler signal. Distal ICA tortuous.  RIGHT VERTEBRAL ARTERY:  Normal flow direction and waveform.  LEFT CAROTID ARTERY: Partially calcified plaque at the carotid bifurcation extending into the proximal internal and external carotid arteries. Mildly elevated peak systolic velocities. At least mild stenosis on grayscale images. Distal ICA tortuous.  LEFT VERTEBRAL ARTERY: Normal flow direction  and waveform.  IMPRESSION: 1. Bilateral carotid bifurcation and proximal ICA plaque, resulting in 50-69% diameter  stenosis, left worse than right. The exam does not exclude plaque ulceration or embolization. Continued surveillance recommended.   Electronically Signed   By: Arne Cleveland M.D.   On: 03/02/2014 11:41    Medications:  Prior to Admission:  Prescriptions prior to admission  Medication Sig Dispense Refill Last Dose  . acetaminophen-codeine (TYLENOL #4) 300-60 MG per tablet Take 1 tablet by mouth 2 (two) times daily. *MAy take one additional tablet for back pain  0 03/01/2014 at Unknown time  . albuterol (PROAIR HFA) 108 (90 BASE) MCG/ACT inhaler Inhale 2 puffs into the lungs every 6 (six) hours as needed for wheezing or shortness of breath.   03/01/2014 at Unknown time  . albuterol (PROVENTIL) (2.5 MG/3ML) 0.083% nebulizer solution Take 2.5 mg by nebulization every 4 (four) hours as needed for wheezing or shortness of breath.    03/01/2014 at Unknown time  . ARIPiprazole (ABILIFY) 30 MG tablet Take 30 mg by mouth every morning.    03/01/2014 at Unknown time  . aspirin EC 81 MG tablet Take 81 mg by mouth every morning.   03/01/2014 at Unknown time  . atorvastatin (LIPITOR) 80 MG tablet Take 80 mg by mouth every evening.    02/28/2014 at Unknown time  . budesonide-formoterol (SYMBICORT) 160-4.5 MCG/ACT inhaler Inhale 2 puffs into the lungs 2 (two) times daily.   03/01/2014 at Unknown time  . clopidogrel (PLAVIX) 75 MG tablet Take 75 mg by mouth every morning.    03/01/2014 at Unknown time  . ferrous sulfate 325 (65 FE) MG tablet Take 325 mg by mouth 2 (two) times daily with a meal.   03/01/2014 at Unknown time  . folic acid (FOLVITE) 035 MCG tablet Take 400 mcg by mouth every morning.    03/01/2014 at Unknown time  . furosemide (LASIX) 40 MG tablet Take 40 mg by mouth as needed for fluid.    02/28/2014 at Unknown time  . hydrochlorothiazide (HYDRODIURIL) 25 MG tablet Take 25 mg by mouth every morning.    03/01/2014 at Unknown time  . isosorbide mononitrate (IMDUR) 30 MG 24 hr tablet Take 30 mg by  mouth every morning.    03/01/2014 at Unknown time  . lansoprazole (PREVACID) 30 MG capsule Take 30 mg by mouth every morning.    03/01/2014 at Unknown time  . LORazepam (ATIVAN) 1 MG tablet Take 1 mg by mouth 2 (two) times daily as needed for anxiety.   Past Week at Unknown time  . metoprolol (LOPRESSOR) 50 MG tablet Take 75 mg by mouth every morning.    03/01/2014 at [place]  . montelukast (SINGULAIR) 10 MG tablet Take 10 mg by mouth every morning.   0 03/01/2014 at Unknown time  . Multiple Vitamin (MULTIVITAMIN WITH MINERALS) TABS tablet Take 1 tablet by mouth every morning.   03/01/2014 at Unknown time  . niacin (NIASPAN) 500 MG CR tablet Take 500 mg by mouth at bedtime.   02/28/2014 at Unknown time  . nitroGLYCERIN (NITROSTAT) 0.4 MG SL tablet Place 0.4 mg under the tongue every 5 (five) minutes as needed for chest pain.   02/27/2014 at Unknown time  . ranolazine (RANEXA) 500 MG 12 hr tablet Take 500 mg by mouth 2 (two) times daily.   03/01/2014 at Unknown time  . venlafaxine XR (EFFEXOR-XR) 150 MG 24 hr capsule Take 150 mg by mouth daily with breakfast.   03/01/2014 at  Unknown time   Scheduled: . ARIPiprazole  30 mg Oral q morning - 10a  . aspirin EC  81 mg Oral q morning - 10a  . atorvastatin  80 mg Oral QPM  . azithromycin  500 mg Oral Q24H  . budesonide (PULMICORT) nebulizer solution  0.25 mg Nebulization BID  . cefTRIAXone (ROCEPHIN)  IV  1 g Intravenous Q24H  . clopidogrel  75 mg Oral QAC breakfast  . enoxaparin (LOVENOX) injection  40 mg Subcutaneous Q24H  . ferrous sulfate  325 mg Oral BID WC  . folic acid  1,594 mcg Oral q morning - 10a  . furosemide  40 mg Oral Daily  . guaiFENesin  1,200 mg Oral BID  . ipratropium  0.5 mg Nebulization QID  . isosorbide mononitrate  30 mg Oral q morning - 10a  . levalbuterol  0.63 mg Nebulization QID  . methylPREDNISolone (SOLU-MEDROL) injection  40 mg Intravenous Q6H  . metoprolol  75 mg Oral q morning - 10a  . montelukast  10 mg Oral q  morning - 10a  . niacin  500 mg Oral QHS  . pantoprazole  40 mg Oral Daily  . ranolazine  500 mg Oral BID  . sodium chloride  3 mL Intravenous Q12H  . sodium chloride  3 mL Intravenous Q12H  . venlafaxine XR  150 mg Oral Q breakfast   Continuous:  VOP:[id number] **OR** acetaminophen, HYDROcodone-acetaminophen, levalbuterol, LORazepam, nitroGLYCERIN, ondansetron **OR** ondansetron (ZOFRAN) IV  Assesment: She was admitted with shortness of breath that is probably a combination of exacerbation of congestive heart failure, community-acquired pneumonia, and COPD exacerbation. She is improving with treatment. She still somewhat short of breath. Principal Problem:   CHF exacerbation Active Problems:   CAD (coronary artery disease), native coronary artery   Hyperlipidemia   COPD exacerbation   CAP (community acquired pneumonia)   Normocytic anemia   CKD (chronic kidney disease), stage III    Plan: Continue current treatments. She would be a candidate for Spiriva when she is discharged. I will be out of town for the next 5 days    LOS: 3 days   Ulisses Vondrak L 03/04/2014, 8:42 AM

## 2014-03-05 ENCOUNTER — Ambulatory Visit: Payer: Self-pay | Admitting: Cardiovascular Disease

## 2014-03-05 NOTE — Progress Notes (Signed)
Subjective: Patient complains of nervousness and shaking. Her breathing is better. She still feels weak and tired. Has not ambulate since admission.  Objective: Vital signs in last 24 hours: Temp:  [97.4 F (36.3 C)-98.8 F (37.1 C)] 98.8 F (37.1 C) (12/03 0523) Pulse Rate:  [78-83] 78 (12/03 0523) Resp:  [20] 20 (12/03 0523) BP: (123-137)/(53-61) 127/53 mmHg (12/03 0523) SpO2:  [92 %-98 %] 92 % (12/03 0710) Weight:  [92.3 kg (203 lb 7.8 oz)] 92.3 kg (203 lb 7.8 oz) (12/03 0523) Weight change: 0.038 kg (1.4 oz) Last BM Date: 03/03/14  Intake/Output from previous day: 12/02 0701 - 12/03 0700 In: 720 [P.O.:720] Out: 1150 [Urine:1150]  PHYSICAL EXAM General appearance: alert and no distress Resp: diminished breath sounds bilaterally and rhonchi bilaterally Cardio: S1, S2 normal GI: soft, non-tender; bowel sounds normal; no masses,  no organomegaly Extremities: extremities normal, atraumatic, no cyanosis or edema  Lab Results:  Results for orders placed or performed during the hospital encounter of 03/01/14 (from the past 48 hour(s))  Basic metabolic panel     Status: Abnormal   Collection Time: 03/04/14  5:10 AM  Result Value Ref Range   Sodium 133 (L) 137 - 147 mEq/L   Potassium 4.3 3.7 - 5.3 mEq/L   Chloride 91 (L) 96 - 112 mEq/L   CO2 31 19 - 32 mEq/L   Glucose, Bld 152 (H) 70 - 99 mg/dL   BUN 35 (H) 6 - 23 mg/dL   Creatinine, Ser 1.57 (H) 0.50 - 1.10 mg/dL   Calcium 8.9 8.4 - 10.5 mg/dL   GFR calc non Af Amer 31 (L) >90 mL/min   GFR calc Af Amer 36 (L) >90 mL/min    Comment: (NOTE) The eGFR has been calculated using the CKD EPI equation. This calculation has not been validated in all clinical situations. eGFR's persistently <90 mL/min signify possible Chronic Kidney Disease.    Anion gap 11 5 - 15    ABGS No results for input(s): PHART, PO2ART, TCO2, HCO3 in the last 72 hours.  Invalid input(s): PCO2 CULTURES No results found for this or any previous visit  (from the past 240 hour(s)). Studies/Results: No results found.  Medications: I have reviewed the patient's current medications.  Assesment: Principal Problem:   CHF exacerbation Active Problems:   CAD (coronary artery disease), native coronary artery   Hyperlipidemia   COPD exacerbation   CAP (community acquired pneumonia)   Normocytic anemia   CKD (chronic kidney disease), stage III anxiety   Plan: Medications reviewed  Continue IV antibiotics, nebulizer treatment and steroid Continue ativan Will do physical therapy consult CBC/BMP in AM    LOS: 4 days   Sharon Sims 03/05/2014, 7:34 AM

## 2014-03-05 NOTE — Progress Notes (Signed)
Physical Therapy Treatment Patient Details Name: Sharon Sims MRN: [id number] DOB: 1940-09-12 Today's Date: 03/05/2014    History of Present Illness HPI: Sharon Sims is a 73 y.o. female with history of CAD managed medically, COPD, hyperlipidemia, carotid stenosis presents to the ER because of worsening shortness of breath. Patient states she has been short of breath over the last 6 days with increasing lower extremity edema. Patient shortness of breath increases on exertion and patient also has been having some productive cough with pleuritic type of chest pain. In the ER patient was found to be wheezing and was placed on nebulizer. On exam patient does have significant lower extremity edema and mildly elevated JVD. Patient is tachycardic. Patient otherwise does not have chest pain without cough. Denies any nausea vomiting abdominal pain diarrhea fever chills. Patient has history of CAD and has had cardiac cath in 2013 and is being medically managed.     PT Comments     Pt was seen for a reassessment.  She reports that she has not been up walking since my last visit.  She has received her rollator and its' fit was assessed and found to be correct.  Pt and husband were instructed in how to operate the functions of the specialized walker.  Pt continues to be independent in transfers and strength is WNL.  She was instructed in gait with the rollator and she was able to ambulate 100' with good stability.  She was on 2 L O2 with no dyspnea on exertion.  Instruction was given for increasing thoracic extension during gait and in observing for upcoming obstacles.  Her function is now significantly improved from what it had been prior to this illness due to the use of the walker and pt/husband are quite pleased with the result.  I have asked CNA to ambulate pt in the hallway with her walker.  No further PT should be needed.  Follow Up Recommendations  No PT follow up     Equipment Recommendations   None recommended by PT (rollator has been received by pt)    Recommendations for Other Services  none     Precautions / Restrictions Precautions Precautions: None Restrictions Weight Bearing Restrictions: No    Mobility  Bed Mobility Overal bed mobility: Independent                Transfers Overall transfer level: Independent Equipment used: Scientist, clinical (histocompatibility and immunogenetics) transfer comment: pt instructed in how to operate her 4 wheeled walker and the fit was assessed  Ambulation/Gait Ambulation/Gait assistance: Modified independent (Device/Increase time) Ambulation Distance (Feet): 100 Feet Assistive device: 4-wheeled walker Gait Pattern/deviations: Trunk flexed Gait velocity: speed appropriate to control dyspnea   General Gait Details: O2 sat was 97% before and after gait on 2 L O2...pt had no dyspnea with exertion...she was instructed in controlling the pace of her gait in order to control dyspnea...gait is very stable   Stairs            Wheelchair Mobility    Modified Rankin (Stroke Patients Only)       Balance Overall balance assessment: No apparent balance deficits (not formally assessed)                                  Cognition Arousal/Alertness: Awake/alert Behavior During Therapy: WFL for tasks assessed/performed Overall Cognitive Status:  Within Functional Limits for tasks assessed                      Exercises      General Comments        Pertinent Vitals/Pain Pain Assessment: No/denies pain    Home Living  lives with husband                     Prior Function            PT Goals (current goals can now be found in the care plan section) Progress towards PT goals: Goals met/education completed, patient discharged from PT    Frequency       PT Plan  (no further PT needed)    Co-evaluation             End of Session Equipment Utilized During Treatment: Gait  belt;Oxygen Activity Tolerance: Patient tolerated treatment well Patient left: in bed;with call bell/phone within reach;with family/visitor present     Time: 5583-1674 PT Time Calculation (Sharon) (ACUTE ONLY): 19 Sharon  Charges:                       G CodesDemetrios Isaacs L 2014/03/16, 8:58 AM

## 2014-03-06 LAB — CBC
HCT: 29.7 % — ABNORMAL LOW (ref 36.0–46.0)
HEMOGLOBIN: 10.1 g/dL — AB (ref 12.0–15.0)
MCH: 31.1 pg (ref 26.0–34.0)
MCHC: 34 g/dL (ref 30.0–36.0)
MCV: 91.4 fL (ref 78.0–100.0)
PLATELETS: 234 10*3/uL (ref 150–400)
RBC: 3.25 MIL/uL — ABNORMAL LOW (ref 3.87–5.11)
RDW: 13.3 % (ref 11.5–15.5)
WBC: 7.7 10*3/uL (ref 4.0–10.5)

## 2014-03-06 LAB — BASIC METABOLIC PANEL
Anion gap: 12 (ref 5–15)
BUN: 35 mg/dL — ABNORMAL HIGH (ref 6–23)
CALCIUM: 8.1 mg/dL — AB (ref 8.4–10.5)
CO2: 30 mEq/L (ref 19–32)
Chloride: 95 mEq/L — ABNORMAL LOW (ref 96–112)
Creatinine, Ser: 1.47 mg/dL — ABNORMAL HIGH (ref 0.50–1.10)
GFR calc Af Amer: 39 mL/min — ABNORMAL LOW (ref >90)
GFR, EST NON AFRICAN AMERICAN: 34 mL/min — AB (ref >90)
GLUCOSE: 147 mg/dL — AB (ref 70–99)
Potassium: 3.8 mEq/L (ref 3.7–5.3)
Sodium: 137 mEq/L (ref 137–147)

## 2014-03-06 MED ORDER — LORAZEPAM 1 MG PO TABS: 1.0000 mg | ORAL_TABLET | Freq: Two times a day (BID) | ORAL | Status: AC | PRN

## 2014-03-06 MED ORDER — HYDROCODONE-ACETAMINOPHEN 5-325 MG PO TABS: 1.0000 | ORAL_TABLET | Freq: Four times a day (QID) | ORAL | Status: AC | PRN

## 2014-03-06 MED ORDER — AMOXICILLIN-POT CLAVULANATE 500-125 MG PO TABS: 1.0000 | ORAL_TABLET | Freq: Three times a day (TID) | ORAL | Status: DC

## 2014-03-06 MED ORDER — PREDNISONE (PAK) 10 MG PO TABS: ORAL_TABLET | Freq: Every day | ORAL | Status: AC

## 2014-03-06 NOTE — Discharge Summary (Signed)
Physician Discharge Summary  Patient ID: Sharon Sims MRN: [id number] DOB/AGE: March 27, 1941 73 y.o. Primary Care Physician:Kerin Kren, MD Admit date: 03/01/2014 Discharge date: 03/06/2014    Discharge Diagnoses:  Principal Problem:   CHF exacerbation Active Problems:   CAD (coronary artery disease), native coronary artery   Hyperlipidemia   COPD exacerbation   CAP (community acquired pneumonia)   Normocytic anemia   CKD (chronic kidney disease), stage III     Medication List    STOP taking these medications        acetaminophen-codeine 300-60 MG per tablet  Commonly known as:  TYLENOL #4     hydrochlorothiazide 25 MG tablet  Commonly known as:  HYDRODIURIL      TAKE these medications        albuterol (2.5 MG/3ML) 0.083% nebulizer solution  Commonly known as:  PROVENTIL  Take 2.5 mg by nebulization every 4 (four) hours as needed for wheezing or shortness of breath.     PROAIR HFA 108 (90 BASE) MCG/ACT inhaler  Generic drug:  albuterol  Inhale 2 puffs into the lungs every 6 (six) hours as needed for wheezing or shortness of breath.     amoxicillin-clavulanate 500-125 MG per tablet  Commonly known as:  AUGMENTIN  Take 1 tablet (500 mg total) by mouth 3 (three) times daily.     ARIPiprazole 30 MG tablet  Commonly known as:  ABILIFY  Take 30 mg by mouth every morning.     aspirin EC 81 MG tablet  Take 81 mg by mouth every morning.     atorvastatin 80 MG tablet  Commonly known as:  LIPITOR  Take 80 mg by mouth every evening.     budesonide-formoterol 160-4.5 MCG/ACT inhaler  Commonly known as:  SYMBICORT  Inhale 2 puffs into the lungs 2 (two) times daily.     clopidogrel 75 MG tablet  Commonly known as:  PLAVIX  Take 75 mg by mouth every morning.     ferrous sulfate 325 (65 FE) MG tablet  Take 325 mg by mouth 2 (two) times daily with a meal.     folic acid 628 MCG tablet  Commonly known as:  FOLVITE  Take 400 mcg by mouth every morning.      furosemide 40 MG tablet  Commonly known as:  LASIX  Take 40 mg by mouth as needed for fluid.     HYDROcodone-acetaminophen 5-325 MG per tablet  Commonly known as:  NORCO/VICODIN  Take 1 tablet by mouth every 6 (six) hours as needed for moderate pain.     isosorbide mononitrate 30 MG 24 hr tablet  Commonly known as:  IMDUR  Take 30 mg by mouth every morning.     lansoprazole 30 MG capsule  Commonly known as:  PREVACID  Take 30 mg by mouth every morning.     LORazepam 1 MG tablet  Commonly known as:  ATIVAN  Take 1 tablet (1 mg total) by mouth 2 (two) times daily as needed for anxiety.     metoprolol 50 MG tablet  Commonly known as:  LOPRESSOR  Take 75 mg by mouth every morning.     montelukast 10 MG tablet  Commonly known as:  SINGULAIR  Take 10 mg by mouth every morning.     multivitamin with minerals Tabs tablet  Take 1 tablet by mouth every morning.     niacin 500 MG CR tablet  Commonly known as:  NIASPAN  Take 500 mg by mouth at bedtime.  nitroGLYCERIN 0.4 MG SL tablet  Commonly known as:  NITROSTAT  Place 0.4 mg under the tongue every 5 (five) minutes as needed for chest pain.     predniSONE 10 MG tablet  Commonly known as:  STERAPRED UNI-PAK  Take by mouth daily. 4 tab po daily for 3 days, 3 tab daily for 3 days, 2 tab po daily for 3 days, 1 tab po daily for 3 days     ranolazine 500 MG 12 hr tablet  Commonly known as:  RANEXA  Take 500 mg by mouth 2 (two) times daily.     venlafaxine XR 150 MG 24 hr capsule  Commonly known as:  EFFEXOR-XR  Take 150 mg by mouth daily with breakfast.        Discharged Condition: improved    Consults: pulmonary  Significant Diagnostic Studies: Dg Chest 1 View  03/01/2014   CLINICAL DATA:  Wheezing and shortness of breath.  Mid chest pain.  EXAM: CHEST - 1 VIEW  COMPARISON:  09/24/2004  FINDINGS: Heart size and pulmonary vascularity are normal and the lungs are clear. No osseous abnormality.  IMPRESSION: Normal  chest.   Electronically Signed   By: Rozetta Nunnery M.D.   On: 03/01/2014 18:57   US Carotid Bilateral  03/02/2014   CLINICAL DATA:  Dizziness x3 months, syncope, coronary artery disease, previous tobacco abuse  EXAM: BILATERAL CAROTID DUPLEX ULTRASOUND  TECHNIQUE: Pearline Cables scale imaging, color Doppler and duplex ultrasound was performed of bilateral carotid and vertebral arteries in the neck.  COMPARISON:  None.  REVIEW OF SYSTEMS: Quantification of carotid stenosis is based on velocity parameters that correlate the residual internal carotid diameter with NASCET-based stenosis levels, using the diameter of the distal internal carotid lumen as the denominator for stenosis measurement.  The following velocity measurements were obtained:  PEAK SYSTOLIC/END DIASTOLIC  RIGHT  ICA:                     148/23cm/sec  CCA:                     76/16WV/PXT  SYSTOLIC ICA/CCA RATIO:  0.62  DIASTOLIC ICA/CCA RATIO: 6.94  ECA:                     155cm/sec  LEFT  ICA:                     223/38cm/sec  CCA:                     854/62VO/JJK  SYSTOLIC ICA/CCA RATIO:  0.93  DIASTOLIC ICA/CCA RATIO: 8.18  ECA:                     219cm/sec  FINDINGS: RIGHT CAROTID ARTERY: Eccentric partially calcified plaque from the right ICA origin into the proximal ICA , without high-grade stenosis. Normal waveforms and color Doppler signal. Distal ICA tortuous.  RIGHT VERTEBRAL ARTERY:  Normal flow direction and waveform.  LEFT CAROTID ARTERY: Partially calcified plaque at the carotid bifurcation extending into the proximal internal and external carotid arteries. Mildly elevated peak systolic velocities. At least mild stenosis on grayscale images. Distal ICA tortuous.  LEFT VERTEBRAL ARTERY: Normal flow direction and waveform.  IMPRESSION: 1. Bilateral carotid bifurcation and proximal ICA plaque, resulting in 50-69% diameter stenosis, left worse than right. The exam does not exclude plaque ulceration or embolization. Continued surveillance  recommended.   Electronically  Signed   By: Arne Cleveland M.D.   On: 03/02/2014 11:41    Lab Results: Basic Metabolic Panel:  Recent Labs  03/04/14 0510 03/06/14 0547  NA 133* 137  K 4.3 3.8  CL 91* 95*  CO2 31 30  GLUCOSE 152* 147*  BUN 35* 35*  CREATININE 1.57* 1.47*  CALCIUM 8.9 8.1*   Liver Function Tests: No results for input(s): AST, ALT, ALKPHOS, BILITOT, PROT, ALBUMIN in the last 72 hours.   CBC:  Recent Labs  03/06/14 0547  WBC 7.7  HGB 10.1*  HCT 29.7*  MCV 91.4  PLT 234    No results found for this or any previous visit (from the past 240 hour(s)).   Hospital Course:   This is a 73 years old female with history of multiple medical illness was admitted due to shortness of breath. She was treated for CHF, COPD exacerbation and possible pneumonia. Her Echo showed normal systolic function. Over the hospital stay patient gradually improved and discharged in stable condition.   Discharge Exam: Blood pressure 149/72, pulse 80, temperature 98.4 F (36.9 C), temperature source Oral, resp. rate 20, height 5\' 6"  (1.676 m), weight 92.3 kg (203 lb 7.8 oz), SpO2 98 %.   Disposition:  home        Follow-up Information    Follow up with [facility].   Contact information:   [address] [place] [address] [phone]       Signed: Rosita Fire   03/06/2014, 7:49 AM

## 2014-03-06 NOTE — Progress Notes (Signed)
Patient discharged with instructions, prescription, and care notes.  Verbalized understanding via teach back.  IV was removed and the site was WNL. Patient voiced no further complaints or concerns at the time of discharge.  Appointments scheduled per instructions.  Patient left the floor via w/c with staff and family in stable condition.  All scheduled medications were given prior to discharge.  I asked the patients husband would he be bringing the patients portable O2 prior to leaving.  The patient stated that she would be okay without it until she gets home.  She just wanted to go home.  I took her to her car on O2 at 3lpm.

## 2014-03-06 NOTE — Progress Notes (Signed)
Pt discharged home today with [facility] RN (per pts choice). Romualdo Bolk of [facility] is aware and will collect the pts information from the chart. [facility] services to start within 48 hours of discharge. Rollator delivered to pts room prior to discharge. Pt and pts nurse aware of discharge arrangements. IM given by Christinia Gully, RN BSN CM.

## 2014-03-09 NOTE — Progress Notes (Signed)
UR chart review completed.  

## 2014-03-16 ENCOUNTER — Encounter: Payer: Self-pay | Admitting: Cardiology

## 2014-03-16 ENCOUNTER — Ambulatory Visit (INDEPENDENT_AMBULATORY_CARE_PROVIDER_SITE_OTHER): Payer: Medicare (Managed Care) | Admitting: Cardiology

## 2014-03-16 VITALS — BP 140/82 | HR 81 | Ht 66.0 in | Wt 206.0 lb

## 2014-03-16 DIAGNOSIS — I6523 Occlusion and stenosis of bilateral carotid arteries: Secondary | ICD-10-CM

## 2014-03-16 DIAGNOSIS — R0602 Shortness of breath: Secondary | ICD-10-CM

## 2014-03-16 DIAGNOSIS — N183 Chronic kidney disease, stage 3 unspecified: Secondary | ICD-10-CM

## 2014-03-16 DIAGNOSIS — I2510 Atherosclerotic heart disease of native coronary artery without angina pectoris: Secondary | ICD-10-CM

## 2014-03-16 DIAGNOSIS — I5032 Chronic diastolic (congestive) heart failure: Secondary | ICD-10-CM

## 2014-03-16 DIAGNOSIS — E785 Hyperlipidemia, unspecified: Secondary | ICD-10-CM

## 2014-03-16 NOTE — Assessment & Plan Note (Signed)
Recent carotid Dopplers showed 50-69% bilateral ICA stenoses. Continue aspirin and statin therapy.

## 2014-03-16 NOTE — Progress Notes (Signed)
Reason for visit: Hospital follow-up, CAD, carotid artery disease, hyperlipidemia  Clinical Summary Ms. Hartis is a 73 y.o.female just seen for the first time in the office in November. History outlined below. She was recently admitted to the hospital by Dr. Legrand Rams with shortness of breath records indicate that she was managed for COPD exacerbation with possible associated pneumonia, and also potential component of diastolic heart failure. She is here with her husband today for follow-up. Still remains short of breath, NYHA class III. Complains of intermittent cough and wheezing despite nebulizer treatments. No obvious angina symptoms. She has not had interval ischemic testing since her heart catheterization at an outside facility in 2013, documented below.  Recent echocardiogram from November 30 showed upper normal LV wall thickness with LVEF 46-80%, grade 1 diastolic dysfunction, mild left atrial enlargement, MAC, sclerotic aortic valve without stenosis, trivial tricuspid regurgitation, unable to assess PASP.   Carotid Dopplers done in November showed 50-69% bilateral ICA stenoses. She continues on aspirin and statin. I discussed these results today with them.  Recent lab work showed hemoglobin 10.1, platelets 234, potassium 3.8, BUN 35, creatinine 1.4, troponin I negative. ECG from November 30 showed sinus rhythm with low voltage, nonspecific T-wave changes.   Allergies  Allergen Reactions  . Ace Inhibitors Swelling    Current Outpatient Prescriptions  Medication Sig Dispense Refill  . albuterol (PROAIR HFA) 108 (90 BASE) MCG/ACT inhaler Inhale 2 puffs into the lungs every 6 (six) hours as needed for wheezing or shortness of breath.    Marland Kitchen albuterol (PROVENTIL) (2.5 MG/3ML) 0.083% nebulizer solution Take 2.5 mg by nebulization every 4 (four) hours as needed for wheezing or shortness of breath.     . ARIPiprazole (ABILIFY) 30 MG tablet Take 30 mg by mouth every morning.     Marland Kitchen aspirin EC 81  MG tablet Take 81 mg by mouth every morning.    Marland Kitchen atorvastatin (LIPITOR) 80 MG tablet Take 80 mg by mouth every evening.     . budesonide-formoterol (SYMBICORT) 160-4.5 MCG/ACT inhaler Inhale 2 puffs into the lungs 2 (two) times daily.    . clopidogrel (PLAVIX) 75 MG tablet Take 75 mg by mouth every morning.     . ferrous sulfate 325 (65 FE) MG tablet Take 325 mg by mouth 2 (two) times daily with a meal.    . folic acid (FOLVITE) 321 MCG tablet Take 400 mcg by mouth every morning.     . furosemide (LASIX) 40 MG tablet Take 40 mg by mouth as needed for fluid.     Marland Kitchen HYDROcodone-acetaminophen (NORCO/VICODIN) 5-325 MG per tablet Take 1 tablet by mouth every 6 (six) hours as needed for moderate pain. 30 tablet 0  . isosorbide mononitrate (IMDUR) 30 MG 24 hr tablet Take 30 mg by mouth every morning.     . lansoprazole (PREVACID) 30 MG capsule Take 30 mg by mouth every morning.     Marland Kitchen LORazepam (ATIVAN) 1 MG tablet Take 1 tablet (1 mg total) by mouth 2 (two) times daily as needed for anxiety. 30 tablet 0  . metoprolol (LOPRESSOR) 50 MG tablet Take 75 mg by mouth every morning.     . montelukast (SINGULAIR) 10 MG tablet Take 10 mg by mouth every morning.   0  . Multiple Vitamin (MULTIVITAMIN WITH MINERALS) TABS tablet Take 1 tablet by mouth every morning.    . niacin (NIASPAN) 500 MG CR tablet Take 500 mg by mouth at bedtime.    . nitroGLYCERIN (NITROSTAT) 0.4  MG SL tablet Place 0.4 mg under the tongue every 5 (five) minutes as needed for chest pain.    . predniSONE (STERAPRED UNI-PAK) 10 MG tablet Take by mouth daily. 4 tab po daily for 3 days, 3 tab daily for 3 days, 2 tab po daily for 3 days, 1 tab po daily for 3 days 30 tablet 0  . ranolazine (RANEXA) 500 MG 12 hr tablet Take 500 mg by mouth 2 (two) times daily.    Marland Kitchen venlafaxine XR (EFFEXOR-XR) 150 MG 24 hr capsule Take 150 mg by mouth daily with breakfast.     No current facility-administered medications for this visit.    Past Medical History    Diagnosis Date  . Osteoarthritis   . History of stroke   . COPD (chronic obstructive pulmonary disease)   . Carotid artery disease   . CAD (coronary artery disease), native coronary artery     Known occluded mid LAD with left to left collaterals, 75% first diagonal - cardiac catheterization in March 2013, [place] [facility]  . Cancer   . Chronic pain   . On home O2   . GERD (gastroesophageal reflux disease)   . Depression   . Hypercholesterolemia     Social History Ms. Spainhower reports that she quit smoking about 4 years ago. Her smoking use included Cigarettes. She has a 67.5 pack-year smoking history. She has never used smokeless tobacco. Ms. Raney reports that she does not drink alcohol.  Review of Systems Complete review of systems negative except as otherwise outlined in the clinical summary and also the following. No reported bleeding problems. ECG bruising however. Stable appetite. No orthopnea. No claudication.  Physical Examination Filed Vitals:   03/16/14 1033  BP: 140/82  Pulse: 81   Filed Weights   03/16/14 1033  Weight: 206 lb (93.441 kg)    Chronically ill-appearing woman, no distress. HEENT: Conjunctiva and lids normal, oropharynx clear. Neck: Supple, no elevated JVP, soft right carotid bruit, no thyromegaly. Lungs: Clear to auscultation with diminished breath sounds throughout, nonlabored breathing at rest. Cardiac: Regular rate and rhythm, no S3 or significant systolic murmur, no pericardial rub. Abdomen: Soft, nontender, bowel sounds present, no guarding or rebound. Extremities: Trace ankle edema, distal pulses 2+. Skin: Warm and dry. Musculoskeletal: Mild kyphosis. Neuropsychiatric: Alert and oriented x3, affect grossly appropriate.   Problem List and Plan   CAD (coronary artery disease), native coronary artery Progressive dyspnea on exertion within the last 6 months, possibly multifactorial with underlying lung disease as well. Recent hospital  admission reviewed. No evidence of ACS at that time. LVEF normal range with mild diastolic dysfunction. In light of her history, I discussed proceeding with a Lexiscan Cardiolite to reevaluate ischemic burden. Depending on severity of abnormalities, we might pursue heart catheterization, since otherwise medical therapy looks fairly good overall. I also recommended that they follow up with Dr. Legrand Rams and consider referral to a pulmonologist.  Chronic diastolic heart failure Overall mild by recent echocardiogram, noted in the setting of ischemic heart disease.  Carotid artery occlusion Recent carotid Dopplers showed 50-69% bilateral ICA stenoses. Continue aspirin and statin therapy.  CKD (chronic kidney disease), stage III Recent lab work showed creatinine down to 1.4.  Hyperlipidemia Continues on Lipitor, tolerating high-dose.    Satira Sark, M.D., F.A.C.C.

## 2014-03-16 NOTE — Assessment & Plan Note (Signed)
Overall mild by recent echocardiogram, noted in the setting of ischemic heart disease.

## 2014-03-16 NOTE — Assessment & Plan Note (Signed)
Progressive dyspnea on exertion within the last 6 months, possibly multifactorial with underlying lung disease as well. Recent hospital admission reviewed. No evidence of ACS at that time. LVEF normal range with mild diastolic dysfunction. In light of her history, I discussed proceeding with a Lexiscan Cardiolite to reevaluate ischemic burden. Depending on severity of abnormalities, we might pursue heart catheterization, since otherwise medical therapy looks fairly good overall. I also recommended that they follow up with Dr. Legrand Rams and consider referral to a pulmonologist.

## 2014-03-16 NOTE — Assessment & Plan Note (Signed)
Continues on Lipitor, tolerating high-dose.

## 2014-03-16 NOTE — Assessment & Plan Note (Signed)
Recent lab work showed creatinine down to 1.4.

## 2014-03-16 NOTE — Patient Instructions (Signed)
Your physician recommends that you schedule a follow-up appointment in: 3 months with [facility]    Your physician recommends that you continue on your current medications as directed. Please refer to the Current Medication list given to you today.      Your physician has requested that you have a lexiscan myoview. For further information please visit [email or web address]. Please follow instruction sheet, as given. We will call you with the results.    Thank you for choosing [facility] !

## 2014-04-13 ENCOUNTER — Encounter (HOSPITAL_COMMUNITY): Payer: PPO

## 2014-04-13 ENCOUNTER — Ambulatory Visit (HOSPITAL_COMMUNITY): Admission: RE | Admit: 2014-04-13 | Payer: PPO | Source: Ambulatory Visit

## 2014-06-22 ENCOUNTER — Ambulatory Visit: Payer: Medicare Other | Admitting: Cardiology

## 2015-01-13 ENCOUNTER — Emergency Department (HOSPITAL_BASED_OUTPATIENT_CLINIC_OR_DEPARTMENT_OTHER): Payer: Medicare Other

## 2015-01-13 ENCOUNTER — Emergency Department (HOSPITAL_BASED_OUTPATIENT_CLINIC_OR_DEPARTMENT_OTHER)
Admission: EM | Admit: 2015-01-13 | Discharge: 2015-01-13 | Disposition: A | Discharge status: Home / Self Care | Payer: Medicare Other | Attending: Emergency Medicine | Admitting: Emergency Medicine

## 2015-01-13 ENCOUNTER — Encounter (HOSPITAL_BASED_OUTPATIENT_CLINIC_OR_DEPARTMENT_OTHER): Payer: Self-pay

## 2015-01-13 DIAGNOSIS — Z87891 Personal history of nicotine dependence: Secondary | ICD-10-CM | POA: Diagnosis not present

## 2015-01-13 DIAGNOSIS — M1990 Unspecified osteoarthritis, unspecified site: Secondary | ICD-10-CM | POA: Insufficient documentation

## 2015-01-13 DIAGNOSIS — I5030 Unspecified diastolic (congestive) heart failure: Secondary | ICD-10-CM | POA: Insufficient documentation

## 2015-01-13 DIAGNOSIS — F329 Major depressive disorder, single episode, unspecified: Secondary | ICD-10-CM | POA: Diagnosis not present

## 2015-01-13 DIAGNOSIS — J441 Chronic obstructive pulmonary disease with (acute) exacerbation: Secondary | ICD-10-CM | POA: Diagnosis not present

## 2015-01-13 DIAGNOSIS — K219 Gastro-esophageal reflux disease without esophagitis: Secondary | ICD-10-CM | POA: Diagnosis not present

## 2015-01-13 DIAGNOSIS — R440 Auditory hallucinations: Secondary | ICD-10-CM | POA: Insufficient documentation

## 2015-01-13 DIAGNOSIS — R441 Visual hallucinations: Secondary | ICD-10-CM | POA: Diagnosis not present

## 2015-01-13 DIAGNOSIS — Z7902 Long term (current) use of antithrombotics/antiplatelets: Secondary | ICD-10-CM | POA: Diagnosis not present

## 2015-01-13 DIAGNOSIS — R51 Headache: Secondary | ICD-10-CM | POA: Diagnosis not present

## 2015-01-13 DIAGNOSIS — Z7982 Long term (current) use of aspirin: Secondary | ICD-10-CM | POA: Insufficient documentation

## 2015-01-13 DIAGNOSIS — R1110 Vomiting, unspecified: Secondary | ICD-10-CM | POA: Insufficient documentation

## 2015-01-13 DIAGNOSIS — Z9981 Dependence on supplemental oxygen: Secondary | ICD-10-CM | POA: Insufficient documentation

## 2015-01-13 DIAGNOSIS — Z79899 Other long term (current) drug therapy: Secondary | ICD-10-CM | POA: Diagnosis not present

## 2015-01-13 DIAGNOSIS — G8929 Other chronic pain: Secondary | ICD-10-CM | POA: Insufficient documentation

## 2015-01-13 DIAGNOSIS — E7800 Pure hypercholesterolemia, unspecified: Secondary | ICD-10-CM | POA: Insufficient documentation

## 2015-01-13 DIAGNOSIS — R109 Unspecified abdominal pain: Secondary | ICD-10-CM | POA: Diagnosis not present

## 2015-01-13 DIAGNOSIS — Z7951 Long term (current) use of inhaled steroids: Secondary | ICD-10-CM | POA: Insufficient documentation

## 2015-01-13 DIAGNOSIS — M7989 Other specified soft tissue disorders: Secondary | ICD-10-CM | POA: Insufficient documentation

## 2015-01-13 DIAGNOSIS — I2510 Atherosclerotic heart disease of native coronary artery without angina pectoris: Secondary | ICD-10-CM | POA: Diagnosis not present

## 2015-01-13 DIAGNOSIS — Z8673 Personal history of transient ischemic attack (TIA), and cerebral infarction without residual deficits: Secondary | ICD-10-CM | POA: Diagnosis not present

## 2015-01-13 DIAGNOSIS — N289 Disorder of kidney and ureter, unspecified: Secondary | ICD-10-CM | POA: Diagnosis not present

## 2015-01-13 HISTORY — DX: Dependence on supplemental oxygen: Z99.81

## 2015-01-13 HISTORY — DX: Sleep apnea, unspecified: G47.30

## 2015-01-13 HISTORY — DX: Heart failure, unspecified: I50.9

## 2015-01-13 HISTORY — DX: Unspecified kidney failure: N19

## 2015-01-13 LAB — COMPREHENSIVE METABOLIC PANEL
ALBUMIN: 3.7 g/dL (ref 3.5–5.0)
ALK PHOS: 46 U/L (ref 38–126)
ALT: 20 U/L (ref 14–54)
ANION GAP: 10 (ref 5–15)
AST: 27 U/L (ref 15–41)
BILIRUBIN TOTAL: 0.7 mg/dL (ref 0.3–1.2)
BUN: 24 mg/dL — ABNORMAL HIGH (ref 6–20)
CALCIUM: 9 mg/dL (ref 8.9–10.3)
CO2: 30 mmol/L (ref 22–32)
Chloride: 99 mmol/L — ABNORMAL LOW (ref 101–111)
Creatinine, Ser: 2.19 mg/dL — ABNORMAL HIGH (ref 0.44–1.00)
GFR calc non Af Amer: 21 mL/min — ABNORMAL LOW (ref >60)
GFR, EST AFRICAN AMERICAN: 24 mL/min — AB (ref >60)
Glucose, Bld: 108 mg/dL — ABNORMAL HIGH (ref 65–99)
POTASSIUM: 3.8 mmol/L (ref 3.5–5.1)
SODIUM: 139 mmol/L (ref 135–145)
Total Protein: 6.6 g/dL (ref 6.5–8.1)

## 2015-01-13 LAB — CBC WITH DIFFERENTIAL/PLATELET
BASOS PCT: 0 %
Basophils Absolute: 0 10*3/uL (ref 0.0–0.1)
EOS ABS: 0.2 10*3/uL (ref 0.0–0.7)
Eosinophils Relative: 3 %
HCT: 32.1 % — ABNORMAL LOW (ref 36.0–46.0)
HEMOGLOBIN: 10.4 g/dL — AB (ref 12.0–15.0)
LYMPHS ABS: 1.8 10*3/uL (ref 0.7–4.0)
Lymphocytes Relative: 22 %
MCH: 31.5 pg (ref 26.0–34.0)
MCHC: 32.4 g/dL (ref 30.0–36.0)
MCV: 97.3 fL (ref 78.0–100.0)
MONO ABS: 0.5 10*3/uL (ref 0.1–1.0)
MONOS PCT: 6 %
NEUTROS PCT: 69 %
Neutro Abs: 5.8 10*3/uL (ref 1.7–7.7)
Platelets: 179 10*3/uL (ref 150–400)
RBC: 3.3 MIL/uL — ABNORMAL LOW (ref 3.87–5.11)
RDW: 13.6 % (ref 11.5–15.5)
WBC: 8.4 10*3/uL (ref 4.0–10.5)

## 2015-01-13 LAB — I-STAT ARTERIAL BLOOD GAS, ED
Acid-Base Excess: 9 mmol/L — ABNORMAL HIGH (ref 0.0–2.0)
BICARBONATE: 34.1 meq/L — AB (ref 20.0–24.0)
O2 Saturation: 97 %
PCO2 ART: 47.4 mmHg — AB (ref 35.0–45.0)
PO2 ART: 81 mmHg (ref 80.0–100.0)
Patient temperature: 97.8
TCO2: 36 mmol/L (ref 0–100)
pH, Arterial: 7.463 — ABNORMAL HIGH (ref 7.350–7.450)

## 2015-01-13 LAB — URINALYSIS, ROUTINE W REFLEX MICROSCOPIC
BILIRUBIN URINE: NEGATIVE
Glucose, UA: NEGATIVE mg/dL
Hgb urine dipstick: NEGATIVE
Ketones, ur: NEGATIVE mg/dL
LEUKOCYTES UA: NEGATIVE
NITRITE: NEGATIVE
PH: 5 (ref 5.0–8.0)
Protein, ur: NEGATIVE mg/dL
SPECIFIC GRAVITY, URINE: 1.009 (ref 1.005–1.030)
Urobilinogen, UA: 0.2 mg/dL (ref 0.0–1.0)

## 2015-01-13 LAB — BRAIN NATRIURETIC PEPTIDE: B Natriuretic Peptide: 111.6 pg/mL — ABNORMAL HIGH (ref 0.0–100.0)

## 2015-01-13 LAB — TROPONIN I

## 2015-01-13 NOTE — ED Notes (Signed)
MD at bedside. 

## 2015-01-13 NOTE — ED Notes (Signed)
Patient transported to X-ray 

## 2015-01-13 NOTE — ED Notes (Addendum)
Pt brought in by daughter in law and granddaughter for c/o pt having hallucinations-pt was started on c-pap recently with decreasing O2 sats-pt not wearing 24hr O2 with c-pap-pt also had increase in ativan from 1mg  tid to 2mg  qid-pt has been taking 4mg  bid-pt is alert and talking in triage then falls asleep

## 2015-01-13 NOTE — ED Notes (Signed)
Patient transported to CT 

## 2015-01-13 NOTE — ED Provider Notes (Signed)
CSN: [id number]   Arrival date & time 01/13/15 1846  History  By signing my name below, I, Altamease Oiler, attest that this documentation has been prepared under the direction and in the presence of Wandra Arthurs, MD. Electronically Signed: Altamease Oiler, ED Scribe. 01/13/2015. 7:53 PM.  Chief Complaint  Patient presents with  . Hallucinations    HPI The history is provided by a relative and the patient. No language interpreter was used.   Sharon Sims is a 74 y.o. female with history of stroke, oxygen dependent COPD,  CHF, coronary artery disease, carotid artery disease, kidney failure who presents to the Emergency Department complaining of new visual and auditory hallucinations with onset last night. Per granddaughter pt saw a butterfly that was not there last night and has been inquiring about firefighters that are not around. Pt states that she hears 2-3 different voices that speak to each other but never to her.   Pt was started on CPAP 6 days ago after a recent sleep study and has not been using her oxygen with it. Last night her oxygen saturation dropped to the 80s at home.  Her Ativan was increased last week because she felt that she was suffocating on her CPAP. She was supposed to take a 1mg  tablet TID but she has been taking at least 2mg  BID and possibly more because she forgets that she already dosed.  Associated symptoms include 1 episode of emesis last night. Also complains of increased wheezing and LE swelling, headache, and "electric" abdominal pain for 4-5 days.  Pt denies chest pain and SI. She sees Dr. Glade Lloyd and Laurin Coder at [facility].   Past Medical History  Diagnosis Date  . Osteoarthritis   . History of stroke   . COPD (chronic obstructive pulmonary disease) ([place])   . Carotid artery disease ([place])   . CAD (coronary artery disease), native coronary artery     Known occluded mid LAD with left to left collaterals, 75% first diagonal - cardiac  catheterization in March 2013, [place]   . Cancer ([place])   . Chronic pain   . On home O2   . GERD (gastroesophageal reflux disease)   . Depression   . Hypercholesterolemia   . Kidney failure   . Sleep apnea   . On home oxygen therapy   . CHF (congestive heart failure) [facility])     Past Surgical History  Procedure Laterality Date  . Abdominal hysterectomy    . Bladder suspension    . Colonoscopy      Family History  Problem Relation Age of Onset  . CAD Mother   . Heart failure Mother   . CAD Father   . Heart failure Father   . Diabetes Mellitus II Sister   . Hypertension Brother   . CAD Brother     Social History  Substance Use Topics  . Smoking status: Former Smoker -- 1.50 packs/day for 45 years    Types: Cigarettes    Quit date: 07/02/2009  . Smokeless tobacco: Never Used  . Alcohol Use: No     Review of Systems  Constitutional: Negative for fever and chills.  Respiratory: Positive for wheezing.   Cardiovascular: Positive for leg swelling. Negative for chest pain.  Gastrointestinal: Positive for vomiting and abdominal pain. Negative for nausea.  Neurological: Positive for headaches. Negative for weakness.  Psychiatric/Behavioral: Positive for hallucinations. Negative for suicidal ideas.  All other systems reviewed and are negative.  Home Medications   Prior  to Admission medications   Medication Sig Start Date End Date Taking? Authorizing Provider  albuterol (PROAIR HFA) 108 (90 BASE) MCG/ACT inhaler Inhale 2 puffs into the lungs every 6 (six) hours as needed for wheezing or shortness of breath.    Historical Provider, MD  albuterol (PROVENTIL) (2.5 MG/3ML) 0.083% nebulizer solution Take 2.5 mg by nebulization every 4 (four) hours as needed for wheezing or shortness of breath.     Historical Provider, MD  ARIPiprazole (ABILIFY) 30 MG tablet Take 30 mg by mouth every morning.     Historical Provider, MD  aspirin EC 81 MG tablet Take 81 mg by mouth every morning.     Historical Provider, MD  atorvastatin (LIPITOR) 80 MG tablet Take 80 mg by mouth every evening.     Historical Provider, MD  budesonide-formoterol (SYMBICORT) 160-4.5 MCG/ACT inhaler Inhale 2 puffs into the lungs 2 (two) times daily.    Historical Provider, MD  clopidogrel (PLAVIX) 75 MG tablet Take 75 mg by mouth every morning.     Historical Provider, MD  ferrous sulfate 325 (65 FE) MG tablet Take 325 mg by mouth 2 (two) times daily with a meal.    Historical Provider, MD  folic acid (FOLVITE) 671 MCG tablet Take 400 mcg by mouth every morning.     Historical Provider, MD  furosemide (LASIX) 40 MG tablet Take 40 mg by mouth as needed for fluid.     Historical Provider, MD  HYDROcodone-acetaminophen (NORCO/VICODIN) 5-325 MG per tablet Take 1 tablet by mouth every 6 (six) hours as needed for moderate pain. 03/06/14   Rosita Fire, MD  isosorbide mononitrate (IMDUR) 30 MG 24 hr tablet Take 30 mg by mouth every morning.     Historical Provider, MD  lansoprazole (PREVACID) 30 MG capsule Take 30 mg by mouth every morning.     Historical Provider, MD  LORazepam (ATIVAN) 1 MG tablet Take 1 tablet (1 mg total) by mouth 2 (two) times daily as needed for anxiety. 03/06/14   Rosita Fire, MD  metoprolol (LOPRESSOR) 50 MG tablet Take 75 mg by mouth every morning.     Historical Provider, MD  montelukast (SINGULAIR) 10 MG tablet Take 10 mg by mouth every morning.  02/22/14   Historical Provider, MD  Multiple Vitamin (MULTIVITAMIN WITH MINERALS) TABS tablet Take 1 tablet by mouth every morning.    Historical Provider, MD  niacin (NIASPAN) 500 MG CR tablet Take 500 mg by mouth at bedtime.    Historical Provider, MD  nitroGLYCERIN (NITROSTAT) 0.4 MG SL tablet Place 0.4 mg under the tongue every 5 (five) minutes as needed for chest pain.    Historical Provider, MD  predniSONE (STERAPRED UNI-PAK) 10 MG tablet Take by mouth daily. 4 tab po daily for 3 days, 3 tab daily for 3 days, 2 tab po daily for 3 days, 1 tab po  daily for 3 days 03/06/14   Rosita Fire, MD  ranolazine (RANEXA) 500 MG 12 hr tablet Take 500 mg by mouth 2 (two) times daily.    Historical Provider, MD  venlafaxine XR (EFFEXOR-XR) 150 MG 24 hr capsule Take 150 mg by mouth daily with breakfast.    Historical Provider, MD    Allergies  Ace inhibitors  Triage Vitals: BP 141/76 mmHg  Pulse 76  Temp(Src) 98.7 F (37.1 C) (Oral)  Resp 28  Ht 5\' 7"  (1.702 m)  Wt 200 lb (90.719 kg)  BMI 31.32 kg/m2  SpO2 96%  Physical Exam  Constitutional: She is  oriented to person, place, and time. She appears well-developed and well-nourished.  Chronically ill, NAD   HENT:  Head: Normocephalic.  Eyes: EOM are normal. Pupils are equal, round, and reactive to light.  Neck: Normal range of motion.  Cardiovascular: Normal rate, regular rhythm and normal heart sounds.   Pulmonary/Chest: Effort normal.  Crackles at bilateral lung bases  Abdominal: Soft. Bowel sounds are normal. She exhibits no distension.  Musculoskeletal: Normal range of motion. She exhibits edema.  2+ BLE edema  Neurological: She is alert and oriented to person, place, and time.  Skin: Skin is warm and dry.  Psychiatric: She has a normal mood and affect.  Nursing note and vitals reviewed.   ED Course  Procedures   DIAGNOSTIC STUDIES: Oxygen Saturation is 96% on 2 L [place], adequate by my interpretation.    COORDINATION OF CARE: 7:46 PM Discussed treatment plan which includes CT head without contrast, CXR, EKG, and lab work with pt and her granddaughter at bedside and they agreed to plan.  Labs Reviewed  CBC WITH DIFFERENTIAL/PLATELET - Abnormal; Notable for the following:    RBC 3.30 (*)    Hemoglobin 10.4 (*)    HCT 32.1 (*)    All other components within normal limits  I-STAT ARTERIAL BLOOD GAS, ED - Abnormal; Notable for the following:    pH, Arterial 7.463 (*)    pCO2 arterial 47.4 (*)    Bicarbonate 34.1 (*)    Acid-Base Excess 9.0 (*)    All other components  within normal limits  COMPREHENSIVE METABOLIC PANEL  TROPONIN I  BRAIN NATRIURETIC PEPTIDE  URINALYSIS, ROUTINE W REFLEX MICROSCOPIC (NOT AT [facility])    Imaging Review No results found.  I personally reviewed and evaluated these images and lab results as a part of my medical decision-making.   EKG Interpretation  Date/Time:  Wednesday January 13 2015 19:30:34 EDT Ventricular Rate:  77 PR Interval:  174 QRS Duration: 72 QT Interval:  382 QTC Calculation: 432 R Axis:   53 Text Interpretation:  Normal sinus rhythm Normal ECG No significant change since last tracing Confirmed by YAO  MD, DAVID ([id number]) on 01/13/2015 7:28:50 PM    MDM   Final diagnoses:  None   Sharon Sims is a 74 y.o. female here with auditory hallucinations. Likely from hypoxia from not using oxygen at night and using CPAP instead. Will need to r/o infection vs CHF vs brain mass.   9:38 PM ABG showed chronic respiratory acidosis that is compensated. Cr slightly elevated (was 1.6 recently now 2.2). UA showed no UTI. CXR showed no pneumonia and BNP only 100. Recommend oxygen 24 hrs daily and repeat Cr outpatient.    I personally performed the services described in this documentation, which was scribed in my presence. The recorded information has been reviewed and is accurate.   Wandra Arthurs, MD 01/13/15 2139

## 2015-01-13 NOTE — ED Notes (Addendum)
When taken to exam room, pt stood from w/c and required no assist with undress and to gown-RT was in triage to assess pt and now at [facility] for ABG draw

## 2015-01-13 NOTE — Discharge Instructions (Signed)
Use oxygen 24 hrs daily.   If you can't fit your oxygen in your CPAP mask, please use oxygen instead.   Call your doctor tomorrow.  Recheck kidney function in a week.   Return to ER if you have worse hallucinations, agitation, vomiting, trouble breathing, turning blue.

## 2016-01-26 IMAGING — CR DG CHEST 2V
2 series · 2 of 2 positions shown · non-contrast
Comparison: 12/13/2014

CLINICAL DATA: Shortness breath.  Wheezing.  Hallucinations.

EXAM:
CHEST  2 VIEW

[w chest pa]
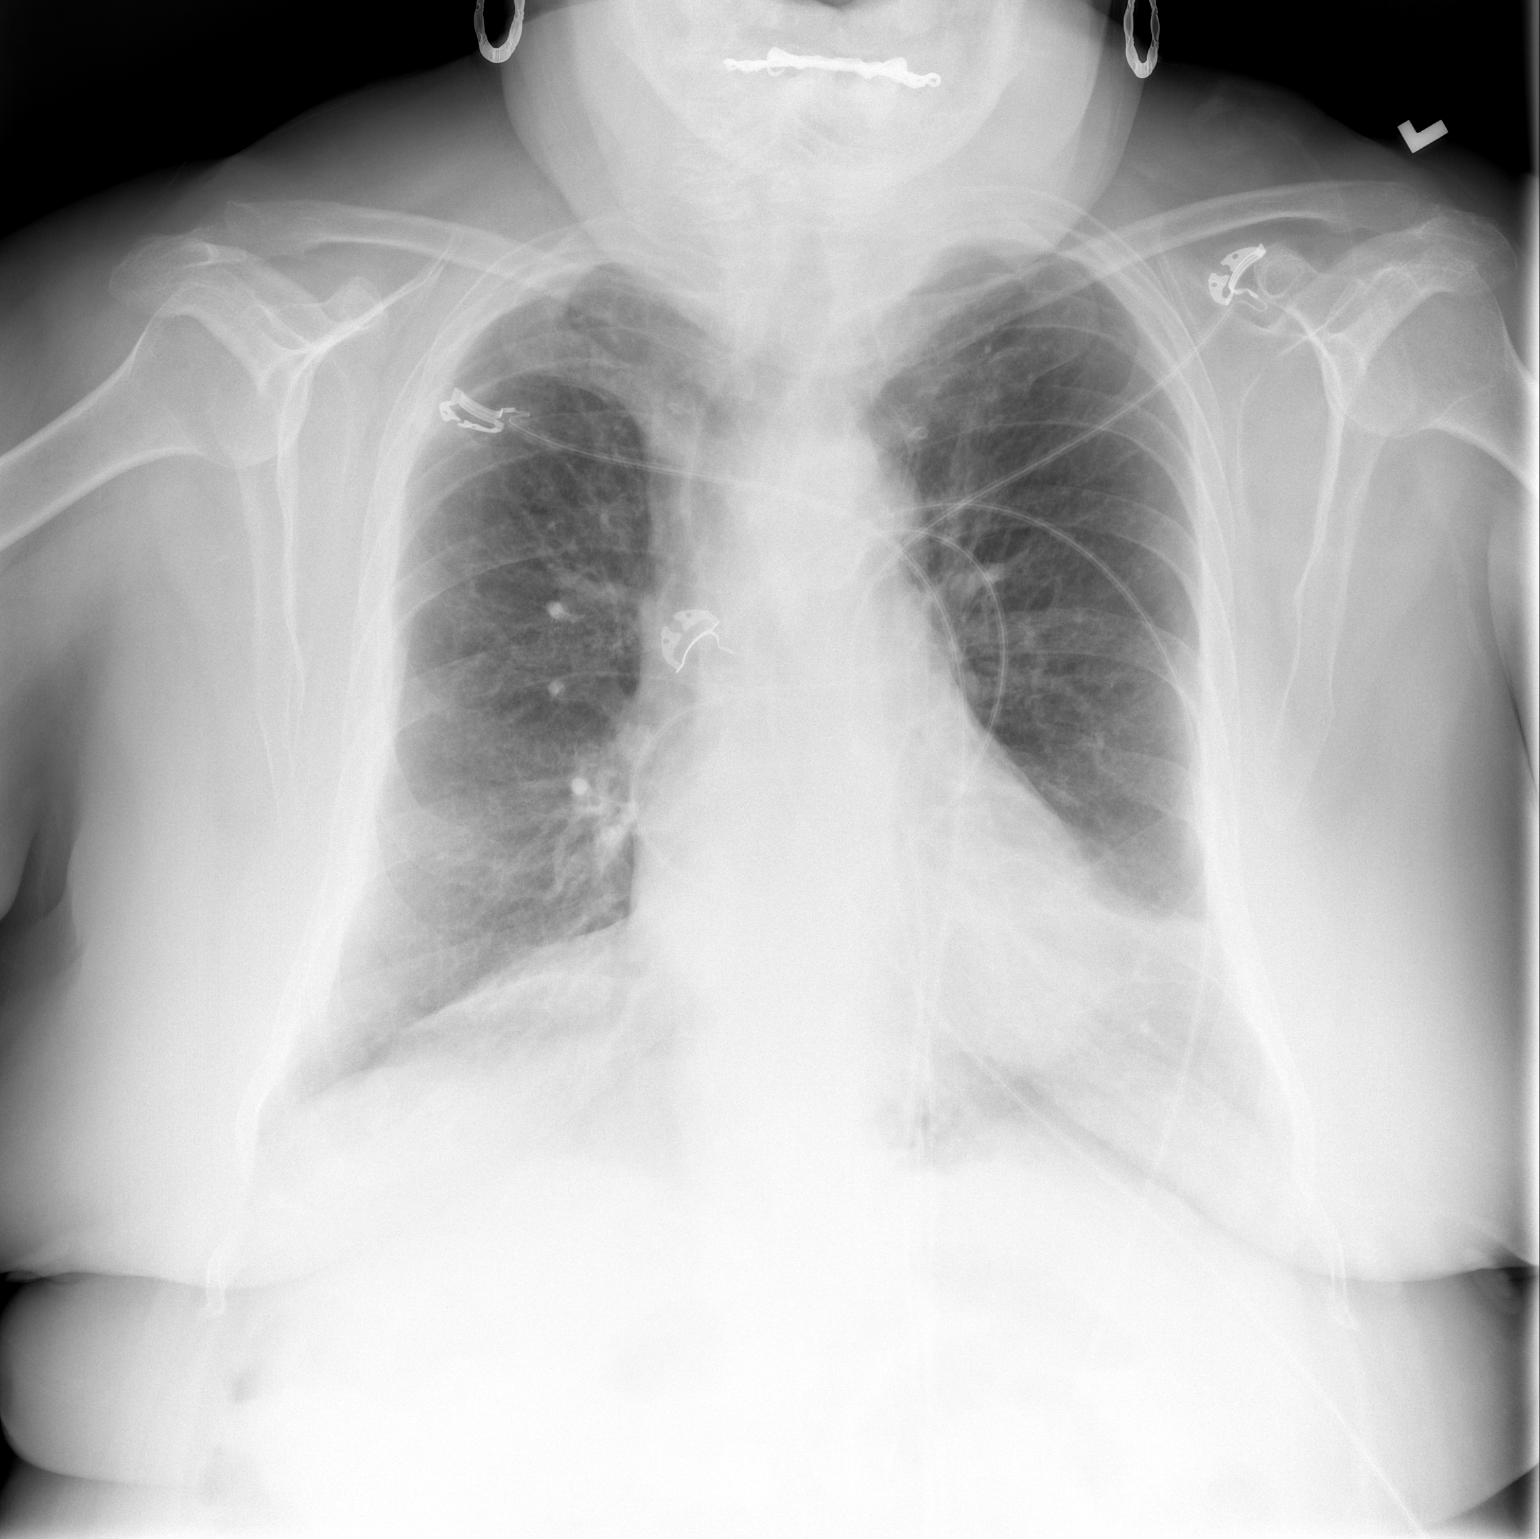

[w chest lat]
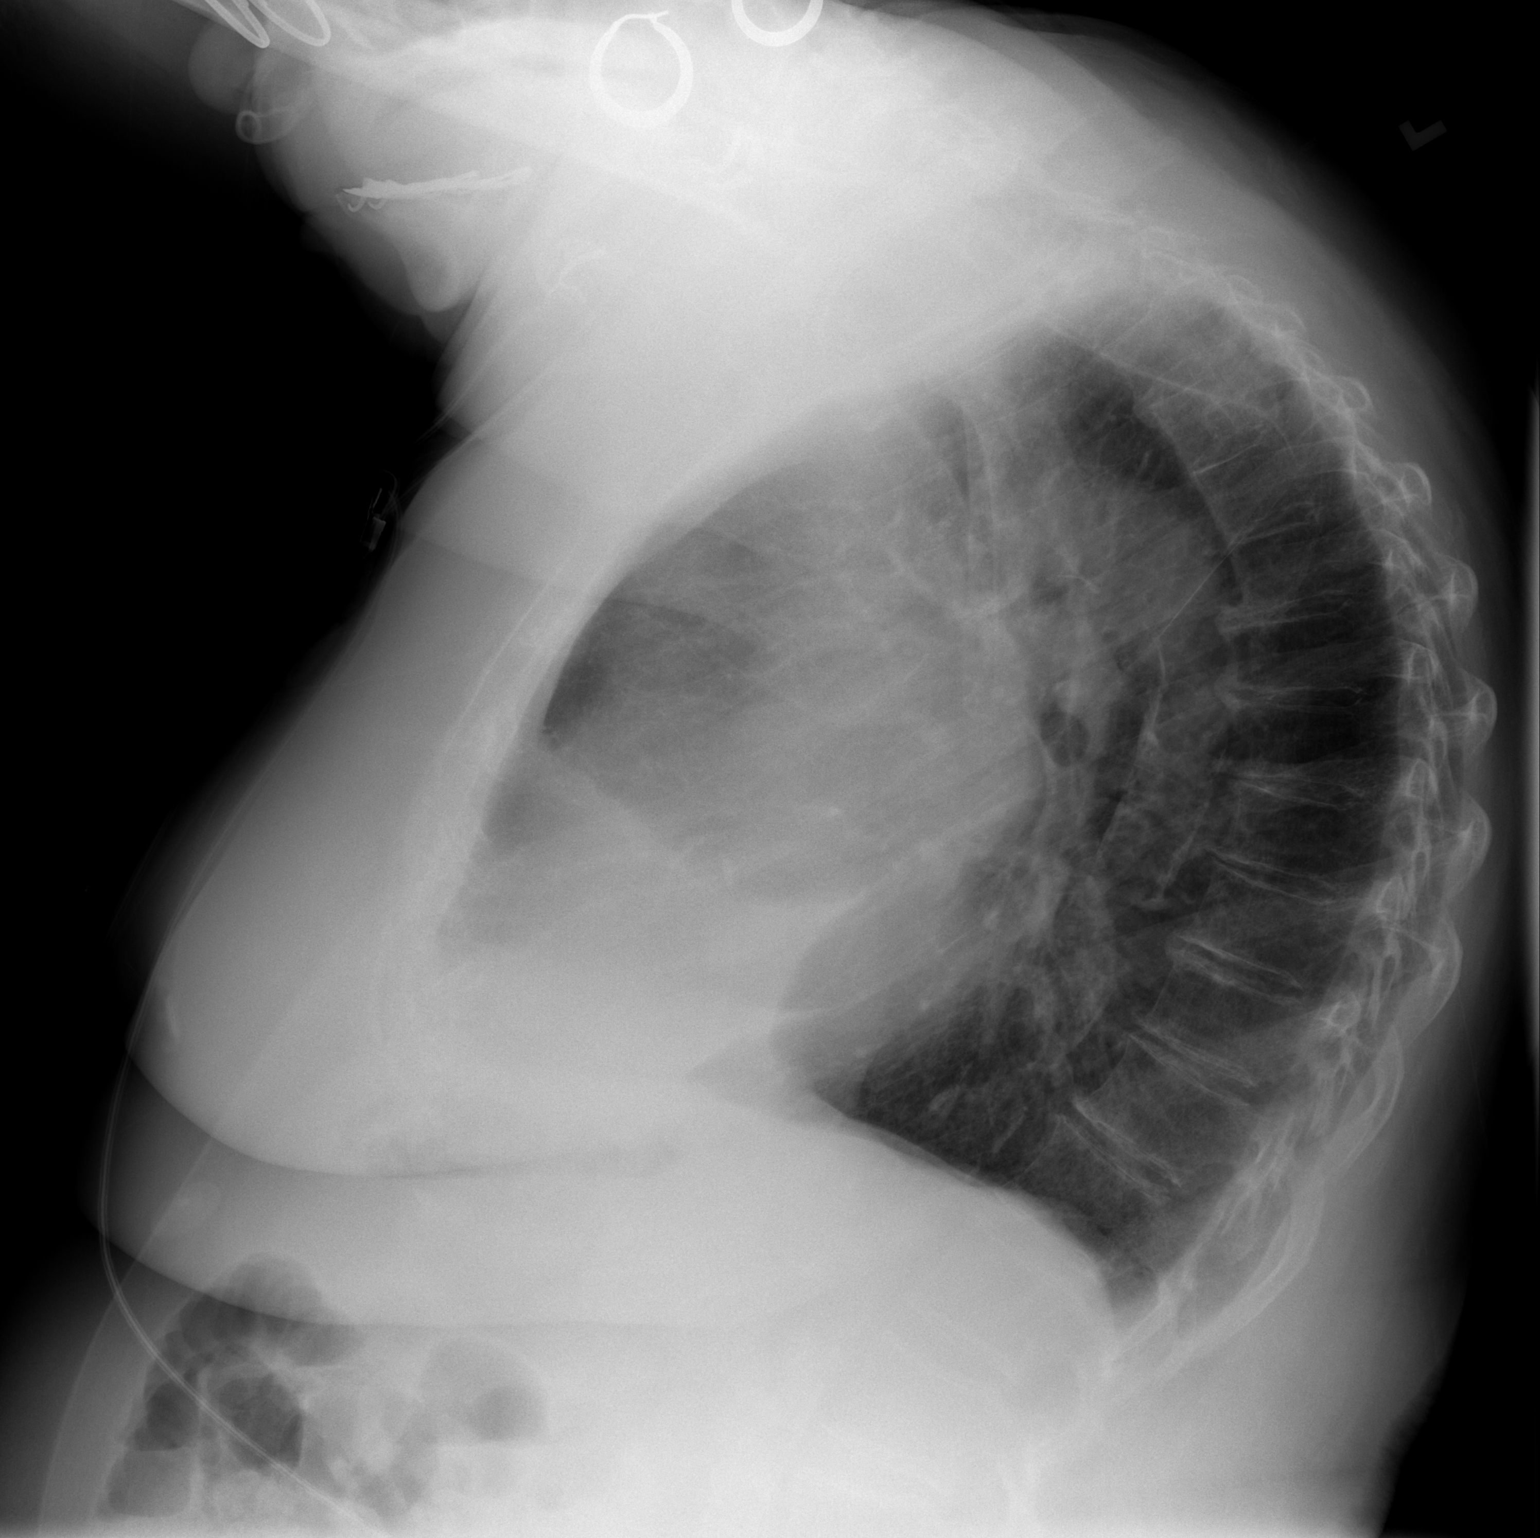

[2 of 2 positions shown; findings below may reference images not displayed]

FINDINGS: Heart size remains stable and at the upper limits of normal. Both
lungs are clear. No evidence of pleural effusion or pneumothorax.
Pulmonary hyperinflation is suspicious for COPD.
IMPRESSION: Stable exam.  Probable COPD.  No active disease.

## 2019-03-04 DEATH — deceased
